# Patient Record
Sex: Female | Born: 1960 | Race: White | Hispanic: No | Marital: Married | Smoking: Never smoker
Health system: Southern US, Community
[De-identification: ages and names within clinical notes are randomized; demographics above are authoritative.]

## PROBLEM LIST (undated history)

## (undated) DIAGNOSIS — E669 Obesity, unspecified: Secondary | ICD-10-CM

## (undated) DIAGNOSIS — I10 Essential (primary) hypertension: Secondary | ICD-10-CM

## (undated) HISTORY — DX: Obesity, unspecified: E66.9

## (undated) HISTORY — DX: Essential (primary) hypertension: I10

## (undated) NOTE — *Deleted (*Deleted)
PROGRESS NOTE  Sandy Reed  DOB: May 03, 1961  PCP: Stacie Glaze, MD ZOX:[id number]  DOA: 10/13/2020  LOS: 0 days   Chief Complaint  Patient presents with  . Loss of Vision   *** Brief narrative: Sandy Reed is a 73 y.o. female with PMH significant for morbid obesity, hypertension, migraine. She presented to the ED on 10/28 with 10 days history slowly progressing right eye visual loss.  She saw an ophthalmologist in the office, noted to have optic nerve edema on examination and was referred to ED for MRI.  Patient described the symptoms as if she was "looking through a [dark grey-translucent] piece of lace" through the central filed of her right eye with the adjacent nasal field "like everything is cut off". It started off as black spots floating which gradually expanded. Prior to admission he had a CT scan of head done in hospital which was negative.  In the ED, MRI brain was obtained which did not show any acute finding.  On-call neurologist was consulted.  Patient was admitted for further work-up .  Subjective: Patient was seen and examined this morning. Symptoms continue.  Patient underwent MRA of her neck with findings as below.  Assessment/Plan: Right Eye Medial Peripheral Visual Loss  -Neurology consult appreciated. 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   MRI of the orbits without acute findings.  MRA head and neck with narrowing and tortuosity of the left vertebral artery which terminates as PICA. Per neurologist review, CCA and right vertebral artery narrowing are more consistent with motion artifact.  She has no symptoms of GCA although CRP is elevated.  Neurologist has ordered for vasculitis  work-up in addition to other possible sources of vision loss: ANA, ANCA, C3/C4, bartonella henselae ab, Lyme PCR  Continue aspirin 81 mg  2D Echo pending  LDL 106 - can treat to LDL goal <130, no increased risk factor of stroke  We'll discharge the patient on aspirin 81 mg daily.  Patient will continue to follow-up with ophthalmology and neurology as an outpatient.  Hypertension -Home meds include verapamil, benazepril and hydrochlorothiazide. -Continue the same to target blood pressure less than 140/90.  History of migraines  Stable for discharge to home today.  Mobility: *** Code Status:   Code Status: Full Code *** Nutritional status: Body mass index is 50.12 kg/m.     Diet Order            Diet Heart Room service appropriate? Yes; Fluid consistency: Thin  Diet effective now                *** DVT prophylaxis: enoxaparin (LOVENOX) injection 40 mg Start: 10/14/20 1000   Antimicrobials: *** Fluid: *** Consultants: *** Family Communication: ***  Status is: Observation  {Observation:23811}  Dispo: The patient is from: {From:23814}              Anticipated d/c is to: {To:23815}              Anticipated d/c date is: {Days:23816}              Patient currently {Medically stable:23817}       Infusions:    Scheduled Meds: .  stroke: mapping our early stages of recovery book   Does not apply  Once  . aspirin EC  81 mg Oral Daily  . atorvastatin  80 mg Oral Daily  . benazepril  10 mg Oral Daily   And  . hydrochlorothiazide  12.5 mg Oral Daily  . clopidogrel  75 mg Oral Daily  . enoxaparin (LOVENOX) injection  40 mg Subcutaneous Q24H    Antimicrobials: Anti-infectives (From admission, onward)   None      PRN meds: acetaminophen **OR** acetaminophen (TYLENOL) oral liquid 160 mg/5 mL **OR** acetaminophen, perflutren lipid microspheres (DEFINITY) IV suspension   Objective: Vitals:   10/14/20 0800 10/14/20 1141  BP: (!) 161/98 140/73  Pulse: 97 91   Resp: (!) 22 20  Temp: 98.7 F (37.1 C) 98.8 F (37.1 C)  SpO2: 93% 97%   No intake or output data in the 24 hours ending 10/14/20 1233 Filed Weights   10/13/20 1607  Weight: (!) 145.2 kg   Weight change:  Body mass index is 50.12 kg/m.   Physical Exam: General exam: Appears calm and comfortable. *** Skin: No rashes, lesions or ulcers. HEENT: Atraumatic, normocephalic, supple neck, no obvious bleeding Lungs: *** CVS: *** GI/Abd *** CNS: *** Psychiatry: *** Extremities: ***  Data Review: I have personally reviewed the laboratory data and studies available.  Recent Labs  Lab 10/13/20 1959 10/14/20 0526  WBC 10.3 8.0  NEUTROABS 7.0  --   HGB 14.7 13.4  HCT 45.4 41.6  MCV 94.4 94.3  PLT 287 272   Recent Labs  Lab 10/13/20 2122 10/14/20 0526  NA 136  --   K 3.6  --   CL 101  --   CO2 23  --   GLUCOSE 91  --   BUN 13  --   CREATININE 0.74 0.68  CALCIUM 9.3  --     F/u labs ***  Signed, Lorin Glass, MD [facility] 10/14/2020

---

## 1998-05-05 ENCOUNTER — Emergency Department (HOSPITAL_COMMUNITY): Admission: EM | Admit: 1998-05-05 | Discharge: 1998-05-05 | Payer: Self-pay | Admitting: Emergency Medicine

## 2000-06-27 ENCOUNTER — Other Ambulatory Visit: Admission: RE | Admit: 2000-06-27 | Discharge: 2000-06-27 | Payer: Self-pay | Admitting: Internal Medicine

## 2001-06-26 ENCOUNTER — Encounter: Payer: Self-pay | Admitting: Emergency Medicine

## 2001-06-26 ENCOUNTER — Emergency Department (HOSPITAL_COMMUNITY): Admission: EM | Admit: 2001-06-26 | Discharge: 2001-06-26 | Payer: Self-pay | Admitting: Emergency Medicine

## 2001-07-03 ENCOUNTER — Encounter: Payer: Self-pay | Admitting: Neurology

## 2001-07-03 ENCOUNTER — Inpatient Hospital Stay (HOSPITAL_COMMUNITY): Admission: AD | Admit: 2001-07-03 | Discharge: 2001-07-04 | Payer: Self-pay | Admitting: Neurology

## 2001-07-03 ENCOUNTER — Encounter: Payer: Self-pay | Admitting: Emergency Medicine

## 2001-07-04 ENCOUNTER — Encounter: Payer: Self-pay | Admitting: Neurology

## 2001-07-11 ENCOUNTER — Emergency Department (HOSPITAL_COMMUNITY): Admission: EM | Admit: 2001-07-11 | Discharge: 2001-07-12 | Payer: Self-pay | Admitting: Emergency Medicine

## 2003-01-18 ENCOUNTER — Other Ambulatory Visit: Admission: RE | Admit: 2003-01-18 | Discharge: 2003-01-18 | Payer: Self-pay | Admitting: Internal Medicine

## 2004-03-02 ENCOUNTER — Other Ambulatory Visit: Admission: RE | Admit: 2004-03-02 | Discharge: 2004-03-02 | Payer: Self-pay | Admitting: Internal Medicine

## 2005-08-30 ENCOUNTER — Ambulatory Visit: Payer: Self-pay | Admitting: Internal Medicine

## 2005-09-12 ENCOUNTER — Other Ambulatory Visit: Admission: RE | Admit: 2005-09-12 | Discharge: 2005-09-12 | Payer: Self-pay | Admitting: Internal Medicine

## 2005-09-12 ENCOUNTER — Ambulatory Visit: Payer: Self-pay | Admitting: Internal Medicine

## 2007-03-06 ENCOUNTER — Ambulatory Visit: Payer: Self-pay | Admitting: Internal Medicine

## 2007-03-06 LAB — CONVERTED CEMR LAB
ALT: 20 units/L (ref 0–40)
AST: 22 units/L (ref 0–37)
Albumin: 3.7 g/dL (ref 3.5–5.2)
Alkaline Phosphatase: 86 units/L (ref 39–117)
BUN: 10 mg/dL (ref 6–23)
Basophils Absolute: 0 10*3/uL (ref 0.0–0.1)
Basophils Relative: 0.5 % (ref 0.0–1.0)
Bilirubin, Direct: 0.1 mg/dL (ref 0.0–0.3)
CO2: 29 meq/L (ref 19–32)
Calcium: 9.1 mg/dL (ref 8.4–10.5)
Chloride: 103 meq/L (ref 96–112)
Cholesterol: 173 mg/dL (ref 0–200)
Creatinine, Ser: 0.6 mg/dL (ref 0.4–1.2)
Eosinophils Absolute: 0.3 10*3/uL (ref 0.0–0.6)
Eosinophils Relative: 4.5 % (ref 0.0–5.0)
GFR calc Af Amer: 138 mL/min
GFR calc non Af Amer: 114 mL/min
Glucose, Bld: 93 mg/dL (ref 70–99)
HCT: 40 % (ref 36.0–46.0)
HDL: 44.5 mg/dL (ref >39.0)
Hemoglobin: 13.6 g/dL (ref 12.0–15.0)
LDL Cholesterol: 111 mg/dL — ABNORMAL HIGH (ref 0–99)
Lymphocytes Relative: 24 % (ref 12.0–46.0)
MCHC: 34.1 g/dL (ref 30.0–36.0)
MCV: 89.6 fL (ref 78.0–100.0)
Monocytes Absolute: 0.4 10*3/uL (ref 0.2–0.7)
Monocytes Relative: 6 % (ref 3.0–11.0)
Neutro Abs: 5 10*3/uL (ref 1.4–7.7)
Neutrophils Relative %: 65 % (ref 43.0–77.0)
Platelets: 337 10*3/uL (ref 150–400)
Potassium: 3.7 meq/L (ref 3.5–5.1)
RBC: 4.46 M/uL (ref 3.87–5.11)
RDW: 12.7 % (ref 11.5–14.6)
Sodium: 139 meq/L (ref 135–145)
TSH: 2.29 microintl units/mL (ref 0.35–5.50)
Total Bilirubin: 0.6 mg/dL (ref 0.3–1.2)
Total CHOL/HDL Ratio: 3.9
Total Protein: 7.2 g/dL (ref 6.0–8.3)
Triglycerides: 90 mg/dL (ref 0–149)
VLDL: 18 mg/dL (ref 0–40)
WBC: 7.5 10*3/uL (ref 4.5–10.5)

## 2007-03-14 ENCOUNTER — Encounter: Payer: Self-pay | Admitting: Internal Medicine

## 2007-03-14 ENCOUNTER — Ambulatory Visit: Payer: Self-pay | Admitting: Internal Medicine

## 2007-03-14 ENCOUNTER — Other Ambulatory Visit: Admission: RE | Admit: 2007-03-14 | Discharge: 2007-03-14 | Payer: Self-pay | Admitting: Internal Medicine

## 2007-08-25 DIAGNOSIS — I10 Essential (primary) hypertension: Secondary | ICD-10-CM | POA: Insufficient documentation

## 2009-05-24 ENCOUNTER — Ambulatory Visit: Payer: Self-pay | Admitting: Internal Medicine

## 2009-05-24 DIAGNOSIS — T887XXA Unspecified adverse effect of drug or medicament, initial encounter: Secondary | ICD-10-CM | POA: Insufficient documentation

## 2009-05-24 DIAGNOSIS — E785 Hyperlipidemia, unspecified: Secondary | ICD-10-CM

## 2009-05-24 DIAGNOSIS — E663 Overweight: Secondary | ICD-10-CM | POA: Insufficient documentation

## 2009-05-24 LAB — CONVERTED CEMR LAB
BUN: 11 mg/dL
Basophils Absolute: 0 K/uL
Basophils Relative: 0.3 %
CO2: 26 meq/L
Calcium: 9.3 mg/dL
Chloride: 111 meq/L
Cholesterol: 199 mg/dL
Creatinine, Ser: 0.6 mg/dL
Direct LDL: 141.6 mg/dL
Eosinophils Absolute: 0.2 K/uL
Eosinophils Relative: 3.1 %
GFR calc non Af Amer: 113.25 mL/min
Glucose, Bld: 86 mg/dL
HCT: 41.5 %
HDL: 41.8 mg/dL
Hemoglobin: 14.2 g/dL
Lymphocytes Relative: 28.1 %
Lymphs Abs: 2.1 K/uL
MCHC: 34.2 g/dL
MCV: 90.6 fL
Monocytes Absolute: 0.5 K/uL
Monocytes Relative: 6.3 %
Neutro Abs: 4.5 K/uL
Neutrophils Relative %: 62.2 %
Platelets: 304 K/uL
Potassium: 3.5 meq/L
RBC: 4.58 M/uL
RDW: 12.9 %
Sodium: 142 meq/L
TSH: 2.26 u[IU]/mL
WBC: 7.3 10*3/microliter

## 2009-08-05 ENCOUNTER — Ambulatory Visit: Payer: Self-pay | Admitting: Internal Medicine

## 2011-05-04 NOTE — Consult Note (Signed)
[facility]  Patient:    Sandy Reed, Sandy Reed                     MRN: [id number] Proc. Date: 07/03/01 Adm. Date:  [id number] Attending:  Fenton Malling                          Consultation Report  CHIEF COMPLAINT:  "Cant speak right side."  HISTORY OF PRESENT ILLNESS:  Sandy Reed is a 50 year old, left-handed woman with a past medical history significant for hypertension, who came to the emergency room last week on June 26, 2001, with complaints of left arm and face numbness, unable to use left arm for 45 minutes.  She had no speech changes then.  Her exam was normal and CT scan of brain was normal.  For some reason an MRI scan of the neck was set up as an outpatient and reportedly showed a disc bulge.  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  She has had a few bouts of emesis associated with this.  No loss of consciousness.  She does have a headache.  In the emergency room she was also noted to have difficulty speaking, would only say I do not know to all questions.  These symptoms are gradually clearing except for the headache and nausea and emesis.  REVIEW OF SYSTEMS:  Positive for headache, nausea and vomiting.  No chest pain or shortness of breath.  PAST MEDICAL HISTORY:  Significant for hypertension and obesity.  No history of diabetes or coronary artery disease.  No surgeries.  No history of migraines.  MEDICATIONS AT HOME:  Lotensin, hydrochlorothiazide, aspirin.  ALLERGIES:  No known drug allergies.  SOCIAL HISTORY:  She is single, no children, does not smoke cigarettes, and does not use alcohol.  Uses occasionally caffeine.  She works for an [employer], educational.  FAMILY HISTORY:  Positive for hypertension and no history of  stroke.  PHYSICAL EXAMINATION:  VITAL SIGNS:  Temperature 97.8, pulse 120, respirations 18, and blood pressure 146/91.  HEENT:  Head is normocephalic, atraumatic.  NECK:  Supple without bruits.  LUNGS:  Clear to auscultation.  HEART:  Regular rate and rhythm.  ABDOMEN:  Obese.  EXTREMITIES:  Without edema.  NEUROLOGIC:  Mental status:  She is awake.  She is beginning to speak more, but having some word finding errors.  Trouble with complex repetition. Trouble with complex commands.  Her naming is intact.  Cranial nerves:  Pupils are equal and reactive.  Disc margins are sharp.  Visual fields are full. Extraocular movements are intact.  Facial sensation is normal.  Facial motor activity is normal.  Hearing is intact.  Palate symmetric and tongue midline. On motor exam she has no drift or satelliting.  Normal rapid fine movement. Normal bulk, tone, and strength.  I did not examine the gait.  Reflexes are 2+ with downgoing toes, coordination finger-to-nose, heel-to-shin are intact. Did not try tandem gait.  Sensory exam is intact.  X-RAY:  CT of the brain is again normal with a probable benign cyst in the right temporal region.  LABORATORY:  So far includes toxicology screen which is negative.  Other labs are pending.  IMPRESSION:  Fluctuating right-sided  numbness and language disturbance, rule out stroke or transient ischemic attack.  History of left-sided numbness one week ago caused me to worry that this could reflect an embolic event.  PLAN:  Will admit to the stroke service at [facility], IV heparin, fluids, oxygen, MRI of brain, MR angiogram, carotid Doppler, cardiac echo, and EEG. DD:  07/03/01 TD:  07/03/01 Job: [id number]

## 2011-05-04 NOTE — Discharge Summary (Signed)
[facility]  Patient:    Sandy Reed, Sandy Reed                     MRN: [id number] Adm. Date:  [id number] Disc. Date: [id number] Attending:  Cathren Reed CC:         Sandy Reed, M.D. [facility]   Discharge Summary  DATE OF BIRTH:  Feb 28, 1961  ADMISSION DIAGNOSES: 1. Transient right-sided numbness and language disturbance, rule out vascular    event. 2. History of hypertension.  DISCHARGE DIAGNOSES: 1. Transient right hemisensory changes and speech disturbance, suspected    migraine event, less likely seizure, much less likely transient ischemic    attack, now resolved. 2. History of hypertension.  CONDITION ON DISCHARGE:  Good.  DIET:  No added salt.  ACTIVITY:  Ad lib.  May return to work on July 14, 2001.  DISCHARGE MEDICATIONS: 1. Plavix 75 mg p.o. q.d. (new). 2. Verapamil SR 120 mg p.o. q.d. (new). 3. Aspirin 325 mg p.o. q.d. 4. Lotensin 20 mg p.o. q.d. 5. Hydrochlorothiazide 25 mg p.o. q.d.  STUDIES: 1. CT head performed July 03, 2001, at [facility], revealing no    acute abnormality. 2. MRI of the brain performed July 04, 2001, at [facility], revealing    no acute abnormality (preliminary). 3. MRA of the brain performed July 04, 2001, revealing hypoplastic left    vertebral artery and no acute abnormality (preliminary). 4. Carotid Doppler performed July 04, 2001, normal. 5. Transthoracic echocardiogram performed July 04, 2001, with normal LV size    and function.  No regional wall motion abnormalities.  EF 55-65%.  Mild    dilation of left atrium. 6. EKG performed July 03, 2001, at [facility], revealing sinus    arrhythmia but no acute abnormalities. 7. EEG performed July 04, 2001, final report pending at discharge, preliminary    report indicating left hemispheric slowing.  LABORATORY DATA:  CBC July 03, 2001, revealing elevated white cell count, improved on July 04, 2001, otherwise normal.   CMET normal except for hypokalemia in the 2.9-3.2 range.  Coagulations normal.  Urinalysis normal. Lipid panel normal.  Homocystine level normal.  Antithrombin III level normal. Prothrombin gene mutation, factor V Leiden, methylmalonic acid level, protein C and protein S activity levels, anticardiolipin antibody, and lupus anticoagulant profile all pending at the time of discharge.  HISTORY OF PRESENT ILLNESS:  Please see admission H&P by Dr. Marcelino Freestone for full admission details.  Briefly, this is a 50 year old woman with a history of hypertension who was admitted to the [facility] Stroke Unit from the [facility] Emergency Room for an episode of spreading right-sided numbness associated with speech disturbance, as well as a history of numbness of the left face and leg occurring about a week prior.  Both episodes were associated with headache, although she has no clear history of migraines.  HOSPITAL COURSE:  She was admitted to the stroke unit, and routine stroke protocol was undertaken.  She was placed on heparin until the completion of her workup.  By the next morning her symptoms of numbness and speech disturbance had improved dramatically.  By the time she was examined on the evening of July 04, 2001, they had essentially resolved, and she complained only of a mild throbbing headache behind the eyes.  She did have some headache and nausea during the hospital stay which was treated with Toradol and Phenergan with success.  She underwent routine  stroke workup, including the above studies, which were all unremarkable.  Of note, much of her hypercoagulable workup was still pending at the time of discharge, as well as an official report on her EEG, which was thought to reveal left hemispheric slowing.  After careful review of the history with the patient, it was felt that the most likely explanation for her symptoms was a migraine phenomenon. She was placed on her usual  medications at discharge, and to this regimen was added Plavix 75 mg p.o. q.d. for additional vascular prophylaxis, as well as sustained-release verapamil 120 mg p.o. q.d. for migraine prophylaxis.  She was advised to watch her blood pressure and watch her symptoms of orthostasis on the verapamil and, if ______  problem, to decrease her Lotensin dose.  She was deemed stable and was discharged home on the evening of July 04, 2001.  FOLLOW-UP:  She will follow up at [facility] next week with Demetrio Lapping, P.A.C., under the direction of Dr. Kelli Hope and Dr. Marcelino Freestone.  Considerations at that time will be possible discontinuation of the Plavix, possible arrangement of a transesophageal echocardiogram, and follow up of the hypercoagulable state workup.  She is to call [phone] on Monday for an appointment for some time next week. DD:  07/04/01 TD:  07/07/01 Job: [id number]

## 2011-06-13 ENCOUNTER — Other Ambulatory Visit: Payer: Self-pay | Admitting: *Deleted

## 2011-06-13 MED ORDER — BENAZEPRIL-HYDROCHLOROTHIAZIDE 20-12.5 MG PO TABS: 1.0000 | ORAL_TABLET | Freq: Every day | ORAL | Status: DC

## 2011-06-13 MED ORDER — VERAPAMIL HCL ER 240 MG PO TBCR: 240.0000 mg | EXTENDED_RELEASE_TABLET | Freq: Every day | ORAL | Status: DC

## 2011-06-27 ENCOUNTER — Other Ambulatory Visit: Payer: Self-pay | Admitting: Internal Medicine

## 2011-06-27 MED ORDER — BENAZEPRIL-HYDROCHLOROTHIAZIDE 20-12.5 MG PO TABS: 1.0000 | ORAL_TABLET | Freq: Every day | ORAL | Status: DC

## 2011-06-27 NOTE — Telephone Encounter (Signed)
resent

## 2011-06-27 NOTE — Telephone Encounter (Signed)
[facility] home delivery sent over a refill req for Benazep/HCTZ 10-12.5 mg 1 per day, last week. Pt says that med has not been rcvd. Pt wants to know if request needs to be re-sent or can this be call in to [facility] Deliver [phone].

## 2012-08-12 ENCOUNTER — Ambulatory Visit (INDEPENDENT_AMBULATORY_CARE_PROVIDER_SITE_OTHER): Payer: Managed Care, Other (non HMO) | Admitting: Internal Medicine

## 2012-08-12 ENCOUNTER — Encounter: Payer: Self-pay | Admitting: Internal Medicine

## 2012-08-12 VITALS — BP 134/84 | HR 76 | Temp 98.2°F | Resp 16 | Ht 67.0 in | Wt 291.0 lb

## 2012-08-12 DIAGNOSIS — E669 Obesity, unspecified: Secondary | ICD-10-CM

## 2012-08-12 DIAGNOSIS — I10 Essential (primary) hypertension: Secondary | ICD-10-CM

## 2012-08-12 DIAGNOSIS — T887XXA Unspecified adverse effect of drug or medicament, initial encounter: Secondary | ICD-10-CM

## 2012-08-12 DIAGNOSIS — E785 Hyperlipidemia, unspecified: Secondary | ICD-10-CM

## 2012-08-12 LAB — LIPID PANEL
HDL: 41.9 mg/dL (ref >39.00)
LDL Cholesterol: 118 mg/dL — ABNORMAL HIGH (ref 0–99)
Total CHOL/HDL Ratio: 4
VLDL: 17.2 mg/dL (ref 0.0–40.0)

## 2012-08-12 LAB — BASIC METABOLIC PANEL
Calcium: 9.4 mg/dL (ref 8.4–10.5)
GFR: 87.75 mL/min (ref >60.00)
Glucose, Bld: 90 mg/dL (ref 70–99)
Potassium: 3.9 mEq/L (ref 3.5–5.1)
Sodium: 141 mEq/L (ref 135–145)

## 2012-08-12 LAB — TSH: TSH: 1.93 u[IU]/mL (ref 0.35–5.50)

## 2012-08-12 LAB — HEPATIC FUNCTION PANEL
AST: 26 U/L (ref 0–37)
Albumin: 4.2 g/dL (ref 3.5–5.2)
Alkaline Phosphatase: 72 U/L (ref 39–117)
Bilirubin, Direct: 0.1 mg/dL (ref 0.0–0.3)
Total Bilirubin: 0.6 mg/dL (ref 0.3–1.2)

## 2012-08-12 MED ORDER — VERAPAMIL HCL ER 240 MG PO TBCR: 240.0000 mg | EXTENDED_RELEASE_TABLET | Freq: Every day | ORAL | Status: DC

## 2012-08-12 MED ORDER — BENAZEPRIL-HYDROCHLOROTHIAZIDE 20-12.5 MG PO TABS: 1.0000 | ORAL_TABLET | Freq: Every day | ORAL | Status: DC

## 2012-08-12 NOTE — Patient Instructions (Signed)
The patient is instructed to continue all medications as prescribed. Schedule followup with check out clerk upon leaving the clinic  

## 2012-08-20 NOTE — Progress Notes (Signed)
Subjective:    Patient ID: Sandy Reed, female    DOB: 08/20/1961, 51 y.o.   MRN: [id number]  HPI Patient is a 51 year old female who presents for followup of hyperlipidemia morbid obesity hypertension.  She is on Lotensin and breath well for blood pressure control which remains adequate We reviewed her weight and weight gain as well as its potential affect on her blood pressure and discussed in detail how diet and exercise would impact both her need for 3 blood pressure medicines and her overall cardiovascular risks.  Reviewed her family history and discussed the effect on her lipids as well as her hypertension of weight loss  Review of Systems  Constitutional: Negative for activity change, appetite change and fatigue.  HENT: Negative for ear pain, congestion, neck pain, postnasal drip and sinus pressure.   Eyes: Negative for redness and visual disturbance.  Respiratory: Negative for cough, shortness of breath and wheezing.   Gastrointestinal: Negative for abdominal pain and abdominal distention.  Genitourinary: Negative for dysuria, frequency and menstrual problem.  Musculoskeletal: Negative for myalgias, joint swelling and arthralgias.  Skin: Negative for rash and wound.  Neurological: Negative for dizziness, weakness and headaches.  Hematological: Negative for adenopathy. Does not bruise/bleed easily.  Psychiatric/Behavioral: Negative for disturbed wake/sleep cycle and decreased concentration.   Past Medical History  Diagnosis Date  . Hypertension   . Obesity     History   Social History  . Marital Status: Married    Spouse Name: N/A    Number of Children: N/A  . Years of Education: N/A   Occupational History  . Not on file.   Social History Main Topics  . Smoking status: Never Smoker   . Smokeless tobacco: Not on file  . Alcohol Use: No  . Drug Use: No  . Sexually Active: Not on file   Other Topics Concern  . Not on file   Social History Narrative  . No  narrative on file    No past surgical history on file.  No family history on file.  Not on File  Current Outpatient Prescriptions on File Prior to Visit  Medication Sig Dispense Refill  . benazepril-hydrochlorthiazide (LOTENSIN HCT) 20-12.5 MG per tablet Take 1 tablet by mouth daily.  90 tablet  1  . verapamil (CALAN-SR) 240 MG CR tablet Take 1 tablet (240 mg total) by mouth daily.  90 tablet  1    BP 134/84  Pulse 76  Temp 98.2 F (36.8 C)  Resp 16  Ht 5\' 7"  (1.702 m)  Wt 291 lb (131.997 kg)  BMI 45.58 kg/m2        Objective:   Physical Exam  Nursing note and vitals reviewed. Constitutional: She is oriented to person, place, and time. She appears well-developed and well-nourished. No distress.  HENT:  Head: Normocephalic and atraumatic.  Right Ear: External ear normal.  Left Ear: External ear normal.  Nose: Nose normal.  Mouth/Throat: Oropharynx is clear and moist.  Eyes: Conjunctivae and EOM are normal. Pupils are equal, round, and reactive to light.  Neck: Normal range of motion. Neck supple. No JVD present. No tracheal deviation present. No thyromegaly present.  Cardiovascular: Normal rate, regular rhythm, normal heart sounds and intact distal pulses.   No murmur heard. Pulmonary/Chest: Effort normal and breath sounds normal. She has no wheezes. She exhibits no tenderness.  Abdominal: Soft. Bowel sounds are normal.  Musculoskeletal: Normal range of motion. She exhibits no edema and no tenderness.  Lymphadenopathy:  She has no cervical adenopathy.  Neurological: She is alert and oriented to person, place, and time. She has normal reflexes. No cranial nerve deficit.  Skin: Skin is warm and dry. She is not diaphoretic.  Psychiatric: She has a normal mood and affect. Her behavior is normal.          Assessment & Plan:  Reviewed morbid obesity and obesity risks  I have spent more than 30 minutes examining this patient face-to-face of which over half was  spent in counseling  Discussed hypertensive control with current medications refilled medications

## 2013-01-30 ENCOUNTER — Other Ambulatory Visit: Payer: Self-pay | Admitting: Internal Medicine

## 2013-02-13 ENCOUNTER — Ambulatory Visit: Payer: Managed Care, Other (non HMO) | Admitting: Internal Medicine

## 2013-05-13 ENCOUNTER — Other Ambulatory Visit: Payer: Self-pay | Admitting: Internal Medicine

## 2013-05-25 ENCOUNTER — Encounter: Payer: Self-pay | Admitting: Internal Medicine

## 2013-05-25 ENCOUNTER — Ambulatory Visit (INDEPENDENT_AMBULATORY_CARE_PROVIDER_SITE_OTHER): Payer: Managed Care, Other (non HMO) | Admitting: Internal Medicine

## 2013-05-25 VITALS — BP 120/78 | HR 76 | Temp 98.6°F | Resp 16 | Ht 67.0 in | Wt 292.0 lb

## 2013-05-25 DIAGNOSIS — E785 Hyperlipidemia, unspecified: Secondary | ICD-10-CM

## 2013-05-25 DIAGNOSIS — I10 Essential (primary) hypertension: Secondary | ICD-10-CM

## 2013-05-25 DIAGNOSIS — E663 Overweight: Secondary | ICD-10-CM

## 2013-05-25 MED ORDER — BENAZEPRIL-HYDROCHLOROTHIAZIDE 20-12.5 MG PO TABS: ORAL_TABLET | ORAL | Status: DC

## 2013-05-25 MED ORDER — VERAPAMIL HCL ER 240 MG PO TBCR: EXTENDED_RELEASE_TABLET | ORAL | Status: DC

## 2013-05-25 NOTE — Progress Notes (Signed)
  Subjective:    Patient ID: Sandy Reed, female    DOB: 07/01/1961, 52 y.o.   MRN: [id number]  HPI Discussion of diet and exercise Weight reduction needed HTN stable   Review of Systems  Constitutional: Negative for activity change, appetite change and fatigue.  HENT: Negative for ear pain, congestion, neck pain, postnasal drip and sinus pressure.   Eyes: Negative for redness and visual disturbance.  Respiratory: Negative for cough, shortness of breath and wheezing.   Gastrointestinal: Negative for abdominal pain and abdominal distention.  Genitourinary: Negative for dysuria, frequency and menstrual problem.  Musculoskeletal: Negative for myalgias, joint swelling and arthralgias.  Skin: Negative for rash and wound.  Neurological: Negative for dizziness, weakness and headaches.  Hematological: Negative for adenopathy. Does not bruise/bleed easily.  Psychiatric/Behavioral: Negative for sleep disturbance and decreased concentration.       Objective:   Physical Exam  Constitutional: She is oriented to person, place, and time. She appears well-developed and well-nourished. No distress.  HENT:  Head: Normocephalic and atraumatic.  Right Ear: External ear normal.  Left Ear: External ear normal.  Nose: Nose normal.  Mouth/Throat: Oropharynx is clear and moist.  Eyes: Conjunctivae and EOM are normal. Pupils are equal, round, and reactive to light.  Neck: Normal range of motion. Neck supple. No JVD present. No tracheal deviation present. No thyromegaly present.  Cardiovascular: Normal rate, regular rhythm, normal heart sounds and intact distal pulses.   No murmur heard. Pulmonary/Chest: Effort normal and breath sounds normal. She has no wheezes. She exhibits no tenderness.  Abdominal: Soft. Bowel sounds are normal.  Musculoskeletal: Normal range of motion. She exhibits no edema and no tenderness.  Lymphadenopathy:    She has no cervical adenopathy.  Neurological: She is alert  and oriented to person, place, and time. She has normal reflexes. No cranial nerve deficit.  Skin: Skin is warm and dry. She is not diaphoretic.  Psychiatric: She has a normal mood and affect. Her behavior is normal.          Assessment & Plan:  Stable HTN Discussion of weight and exercise

## 2013-11-09 ENCOUNTER — Encounter: Payer: Managed Care, Other (non HMO) | Admitting: Internal Medicine

## 2014-06-11 ENCOUNTER — Other Ambulatory Visit: Payer: Self-pay | Admitting: Internal Medicine

## 2014-08-22 ENCOUNTER — Other Ambulatory Visit: Payer: Self-pay | Admitting: Internal Medicine

## 2014-09-08 ENCOUNTER — Telehealth: Payer: Self-pay | Admitting: Internal Medicine

## 2014-09-08 NOTE — Telephone Encounter (Signed)
Pt called upset about her re-fills being refused due to the need for an office visit.  Pt asked when was the last time she was in the office and I told her it was 05/2013.  She asked about going to Saturday clinic for re-fills and I explained the Saturday clinic is for acute issues.   She said she can't take time off of work to see a physician to establish care or med check and will have to do without her medication.  Pt then stated "she will have to do without medication and said thank you very much".  The call then ended.

## 2020-10-13 ENCOUNTER — Other Ambulatory Visit: Payer: Self-pay

## 2020-10-13 ENCOUNTER — Encounter (HOSPITAL_COMMUNITY): Payer: Self-pay | Admitting: Emergency Medicine

## 2020-10-13 ENCOUNTER — Emergency Department (HOSPITAL_COMMUNITY): Payer: Managed Care, Other (non HMO)

## 2020-10-13 ENCOUNTER — Observation Stay (HOSPITAL_COMMUNITY)
Admission: EM | Admit: 2020-10-13 | Discharge: 2020-10-14 | Disposition: A | Discharge status: Home / Self Care | Payer: Managed Care, Other (non HMO) | Attending: Internal Medicine | Admitting: Internal Medicine

## 2020-10-13 DIAGNOSIS — Z20822 Contact with and (suspected) exposure to covid-19: Secondary | ICD-10-CM | POA: Insufficient documentation

## 2020-10-13 DIAGNOSIS — H34231 Retinal artery branch occlusion, right eye: Secondary | ICD-10-CM | POA: Diagnosis present

## 2020-10-13 DIAGNOSIS — H3411 Central retinal artery occlusion, right eye: Secondary | ICD-10-CM | POA: Diagnosis not present

## 2020-10-13 DIAGNOSIS — H4710 Unspecified papilledema: Secondary | ICD-10-CM | POA: Diagnosis not present

## 2020-10-13 DIAGNOSIS — H547 Unspecified visual loss: Secondary | ICD-10-CM | POA: Diagnosis present

## 2020-10-13 DIAGNOSIS — Z79899 Other long term (current) drug therapy: Secondary | ICD-10-CM | POA: Insufficient documentation

## 2020-10-13 DIAGNOSIS — I10 Essential (primary) hypertension: Secondary | ICD-10-CM | POA: Diagnosis not present

## 2020-10-13 DIAGNOSIS — H53451 Other localized visual field defect, right eye: Secondary | ICD-10-CM | POA: Diagnosis present

## 2020-10-13 LAB — BASIC METABOLIC PANEL
Anion gap: 12 (ref 5–15)
BUN: 13 mg/dL (ref 6–20)
CO2: 23 mmol/L (ref 22–32)
Calcium: 9.3 mg/dL (ref 8.9–10.3)
Chloride: 101 mmol/L (ref 98–111)
Creatinine, Ser: 0.74 mg/dL (ref 0.44–1.00)
GFR, Estimated: >60 mL/min (ref >60)
Glucose, Bld: 91 mg/dL (ref 70–99)
Potassium: 3.6 mmol/L (ref 3.5–5.1)
Sodium: 136 mmol/L (ref 135–145)

## 2020-10-13 LAB — SEDIMENTATION RATE: Sed Rate: 17 mm/hr (ref 0–22)

## 2020-10-13 LAB — CBC WITH DIFFERENTIAL/PLATELET
Abs Immature Granulocytes: 0.03 10*3/uL (ref 0.00–0.07)
Basophils Absolute: 0.1 10*3/uL (ref 0.0–0.1)
Basophils Relative: 1 %
Eosinophils Absolute: 0.3 10*3/uL (ref 0.0–0.5)
Eosinophils Relative: 3 %
HCT: 45.4 % (ref 36.0–46.0)
Hemoglobin: 14.7 g/dL (ref 12.0–15.0)
Immature Granulocytes: 0 %
Lymphocytes Relative: 22 %
Lymphs Abs: 2.3 10*3/uL (ref 0.7–4.0)
MCH: 30.6 pg (ref 26.0–34.0)
MCHC: 32.4 g/dL (ref 30.0–36.0)
MCV: 94.4 fL (ref 80.0–100.0)
Monocytes Absolute: 0.7 10*3/uL (ref 0.1–1.0)
Monocytes Relative: 6 %
Neutro Abs: 7 10*3/uL (ref 1.7–7.7)
Neutrophils Relative %: 68 %
Platelets: 287 10*3/uL (ref 150–400)
RBC: 4.81 MIL/uL (ref 3.87–5.11)
RDW: 13 % (ref 11.5–15.5)
WBC: 10.3 10*3/uL (ref 4.0–10.5)
nRBC: 0 % (ref 0.0–0.2)

## 2020-10-13 LAB — C-REACTIVE PROTEIN: CRP: 2 mg/dL — ABNORMAL HIGH (ref <1.0)

## 2020-10-13 IMAGING — MR MR ORBITS WO/W CM
4 of 6 series · 19 of 48 positions shown · IV contrast (gadavist)
Comparison: None.

CLINICAL DATA: Headache and papilledema

EXAM:
MRI HEAD AND ORBITS WITHOUT AND WITH CONTRAST
TECHNIQUE: Multiplanar, multiecho pulse sequences of the brain and surrounding
structures were obtained without and with intravenous contrast.
Multiplanar, multiecho pulse sequences of the orbits and surrounding
structures were obtained including fat saturation techniques, before
and after intravenous contrast administration.
CONTRAST:  10mL GADAVIST GADOBUTROL 1 MMOL/ML IV SOLN

[Series 8: T2 fat-sat · coronal · 4.0mm · 0.35mm/px · 9 of 23 slices shown (1 of 2)]
[im 1/23]
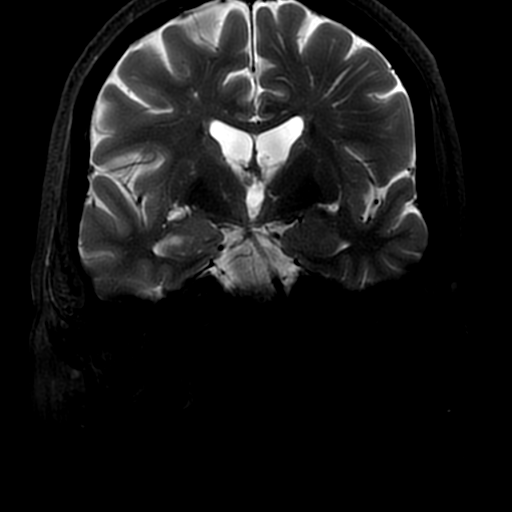
[im 3/23]
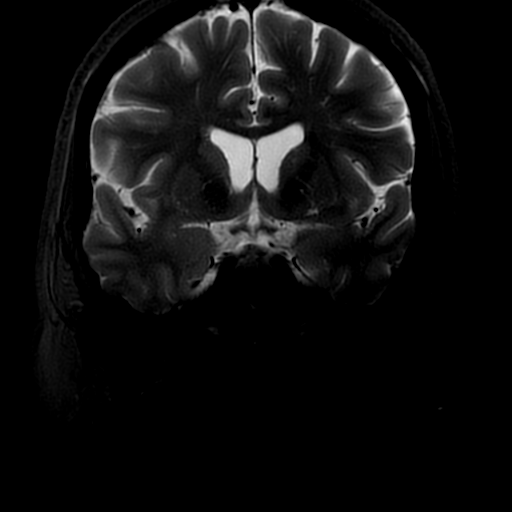
[im 6/23]
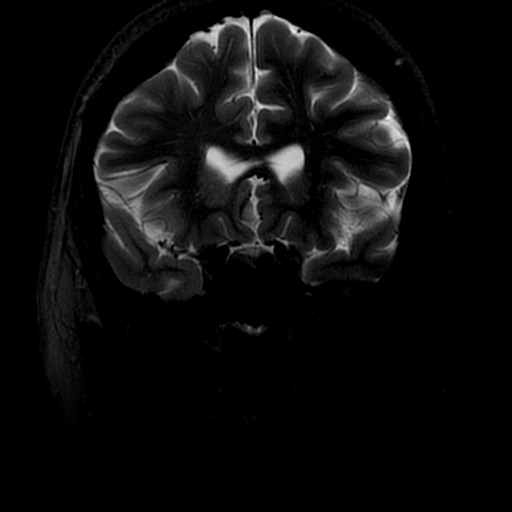
[im 9/23]
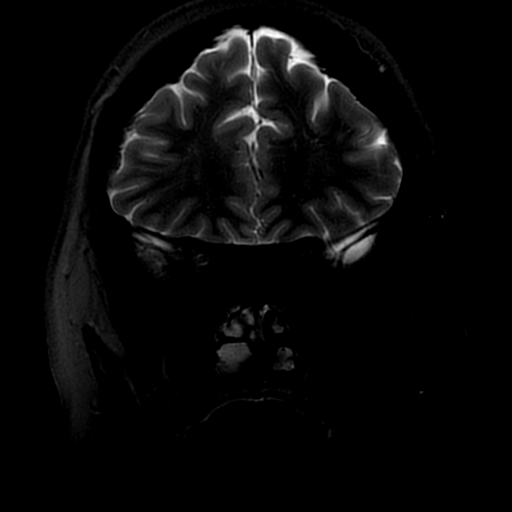
[im 12/23]
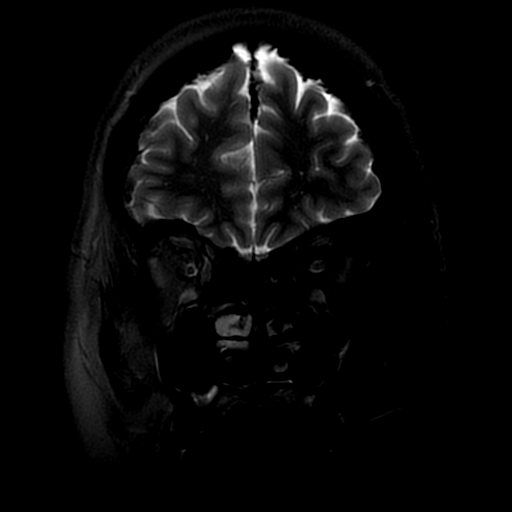
[im 14/23]
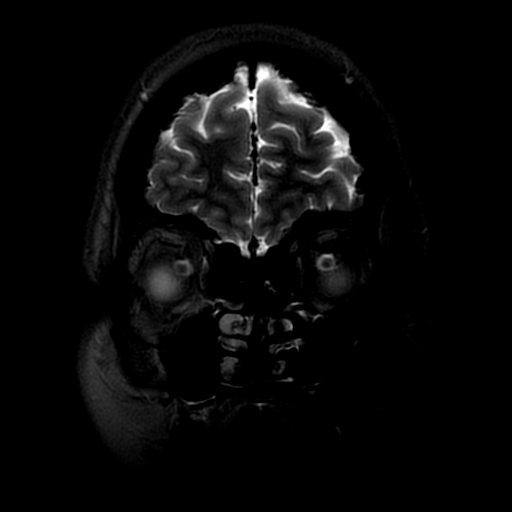
[im 17/23]
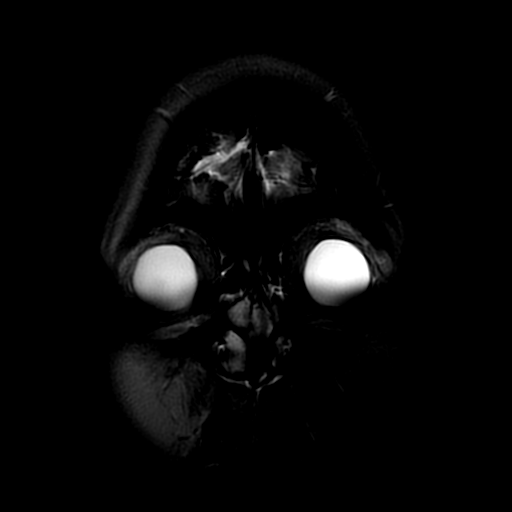
[im 20/23]
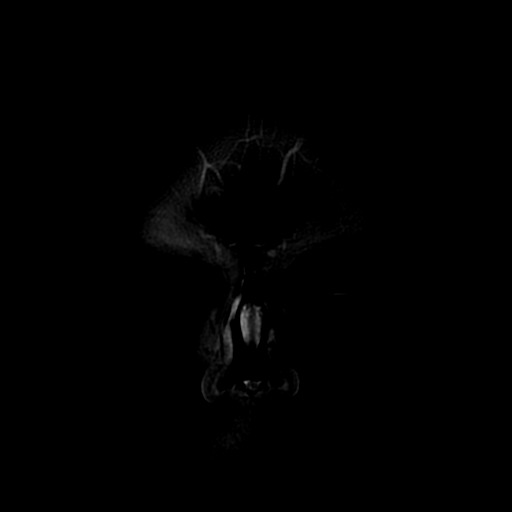
[im 23/23]
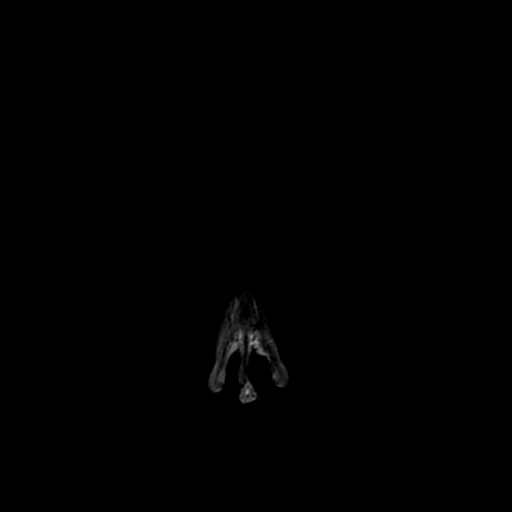

[Series 9: T2 fat-sat · axial · 3.0mm · 0.35mm/px · z∈[-46,-2]mm · 4 of 20 slices shown (2 of 2)]
[im 1/20]
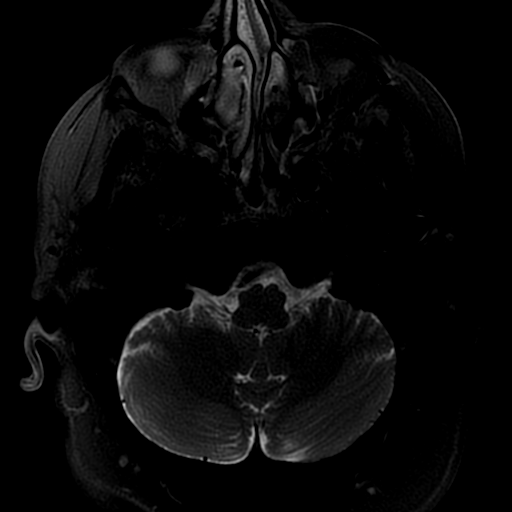
[im 4/20]
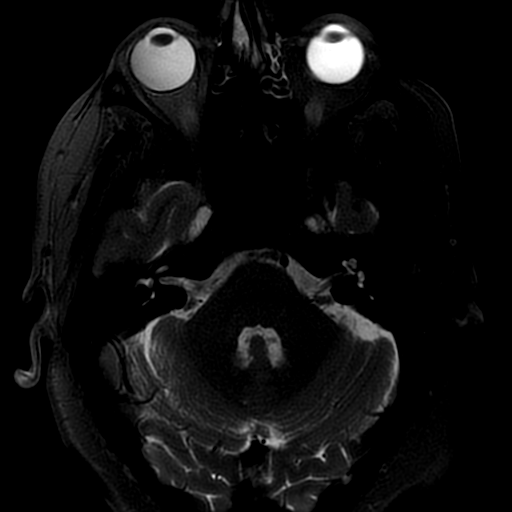
[im 10/20]
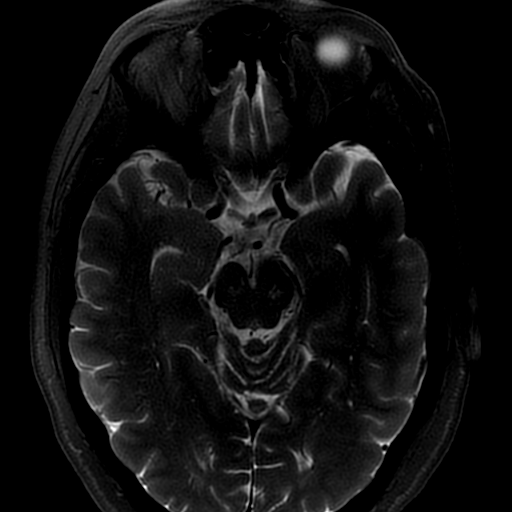
[im 16/20]
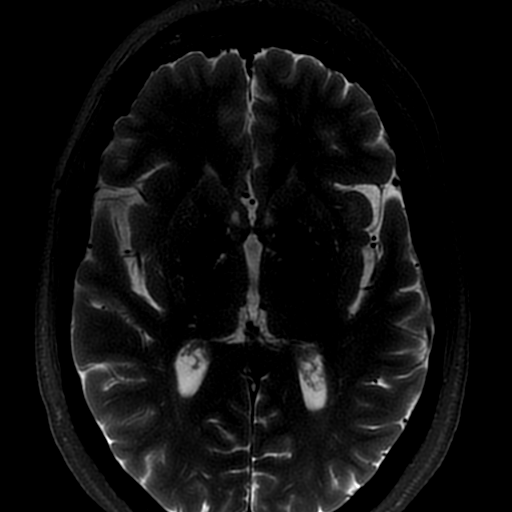

[Series 10: T1 · coronal · 4.0mm · 0.35mm/px · 3 of 23 slices shown (1 of 2)]
[im 3/23]
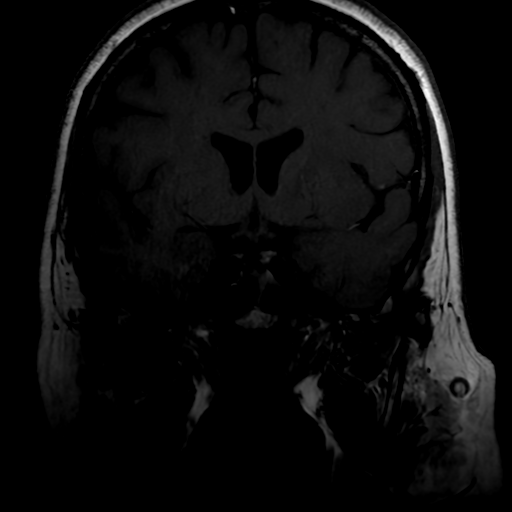
[im 12/23]
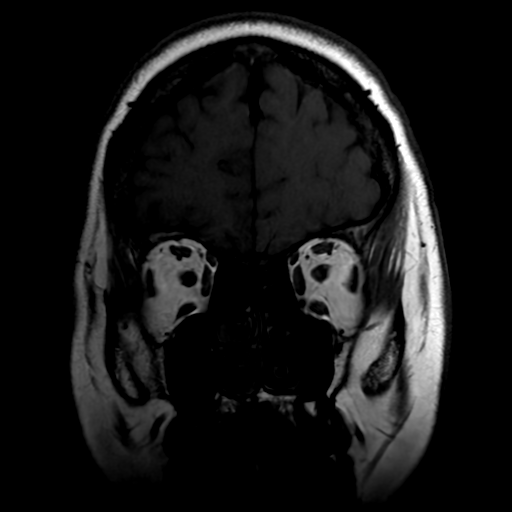
[im 20/23]
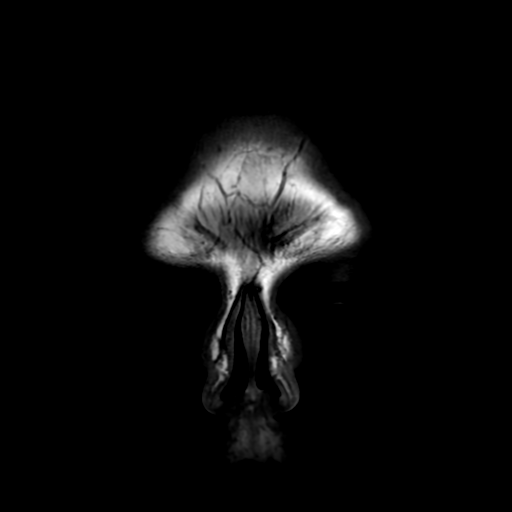

[Series 11: T1 · axial · 3.0mm · 0.35mm/px · z∈[-37,-2]mm · 3 of 20 slices shown (2 of 2)]
[im 4/20]
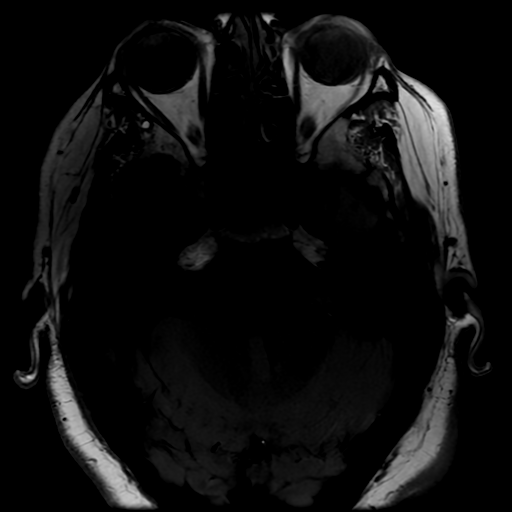
[im 10/20]
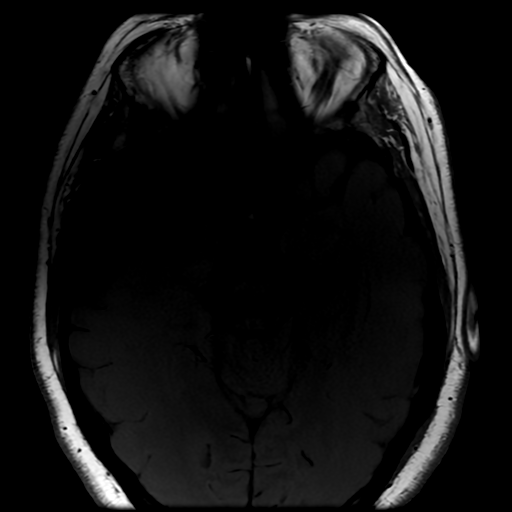
[im 16/20]
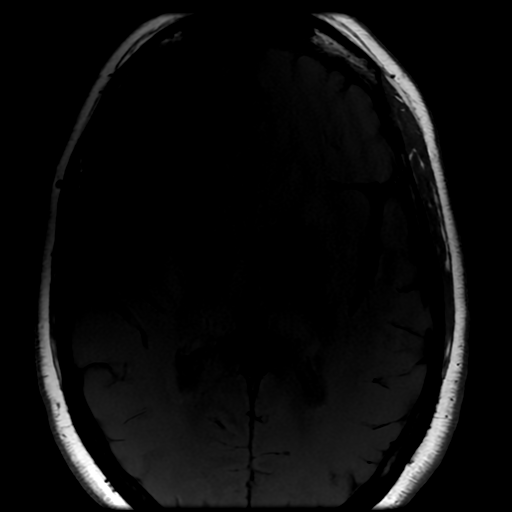

[19 of 48 positions shown; findings below may reference images not displayed]

FINDINGS: MRI HEAD FINDINGS

Brain: No acute infarct, acute hemorrhage or extra-axial collection.
Multifocal hyperintense T2-weighted signal within the white matter.
Normal volume of CSF spaces. No chronic microhemorrhage. Normal
midline structures. There is no abnormal contrast enhancement.

Vascular: Normal flow voids.

Skull and upper cervical spine: Normal marrow signal.

Other: None.

MRI ORBITS FINDINGS

Orbits: No traumatic or inflammatory finding. Globes, optic nerves,
orbital fat, extraocular muscles, vascular structures, and lacrimal
glands are normal.

Visualized sinuses: Clear.

Soft tissues: Negative.
IMPRESSION: 1. Normal MRI of the orbits.
2. No acute intracranial abnormality.
3. Multifocal hyperintense T2-weighted signal within the white
matter, consistent with chronic small vessel disease.

## 2020-10-13 IMAGING — MR MR HEAD WO/W CM
8 of 10 series · 28 of 48 positions shown · IV contrast (gadavist)
Comparison: None.

CLINICAL DATA: Headache and papilledema

EXAM:
MRI HEAD AND ORBITS WITHOUT AND WITH CONTRAST
TECHNIQUE: Multiplanar, multiecho pulse sequences of the brain and surrounding
structures were obtained without and with intravenous contrast.
Multiplanar, multiecho pulse sequences of the orbits and surrounding
structures were obtained including fat saturation techniques, before
and after intravenous contrast administration.
CONTRAST:  10mL GADAVIST GADOBUTROL 1 MMOL/ML IV SOLN

[Series 2: FLAIR · sagittal · 5.0mm · 0.47mm/px · 2 of 26 slices shown (1 of 2)]
[im 1/26]
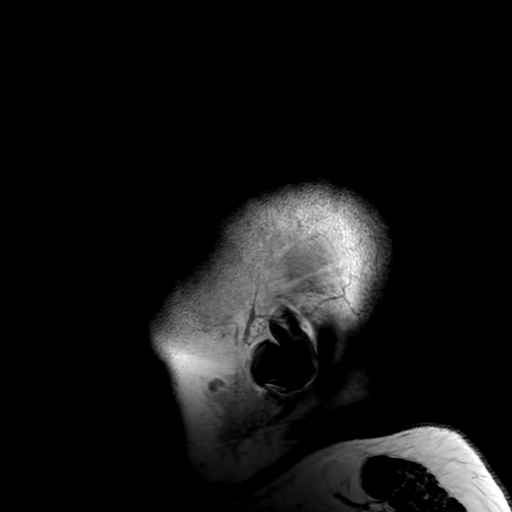
[im 26/26]
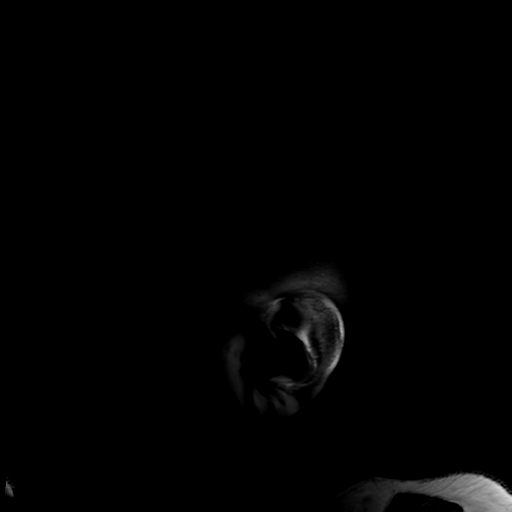

[Series 3: DWI · axial · 3.0mm · 0.94mm/px · z∈[-71,+82]mm · 8 of 104 slices shown]
[im 1/104]
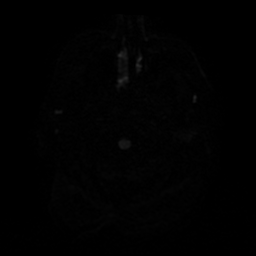
[im 12/104]
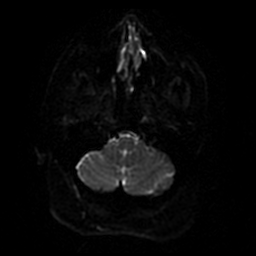
[im 35/104]
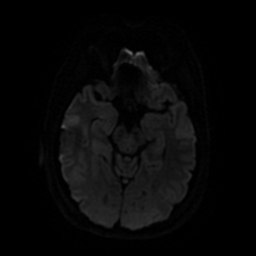
[im 46/104]
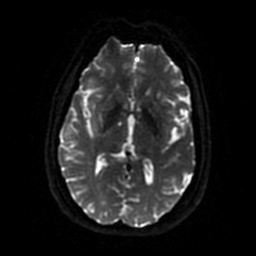
[im 58/104]
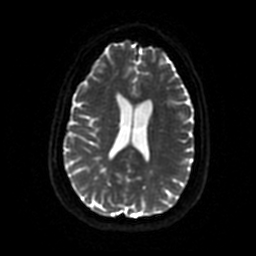
[im 69/104]
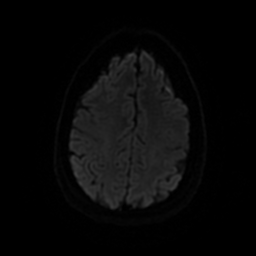
[im 92/104]
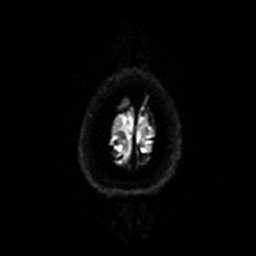
[im 104/104]
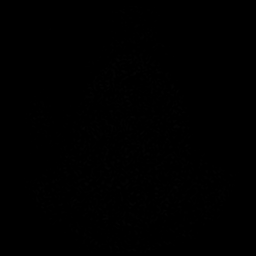

[Series 4: T2 · axial · 5.0mm · 0.47mm/px · z∈[-76,+79]mm · 3 of 27 slices shown]
[im 1/27]
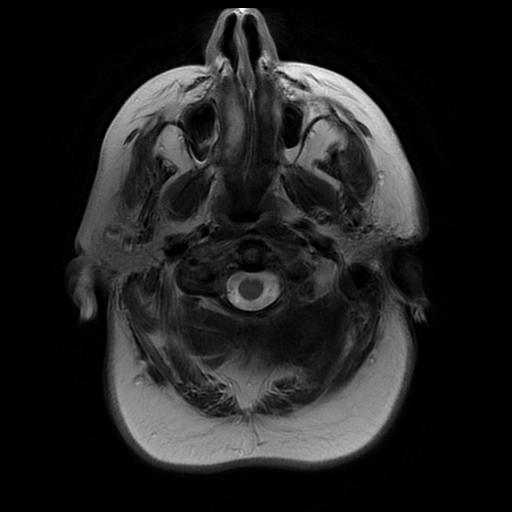
[im 14/27]
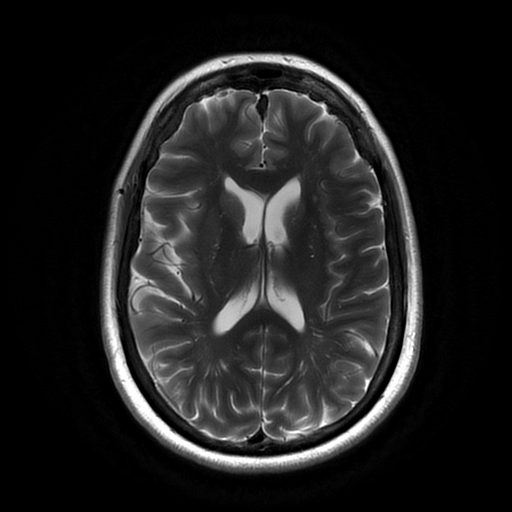
[im 27/27]
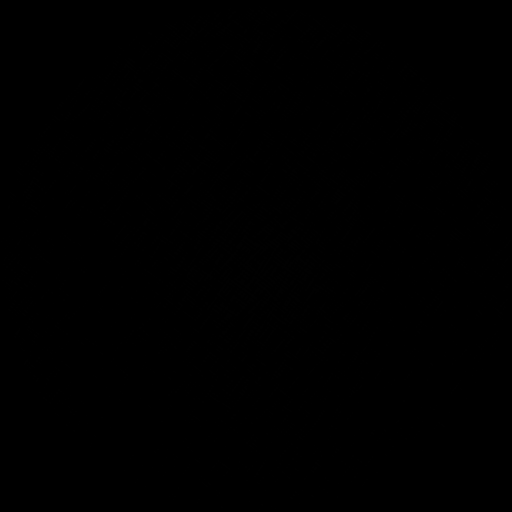

[Series 5: FLAIR · axial · 3.0mm · 0.45mm/px · z∈[-82,+68]mm · 2 of 26 slices shown (2 of 2)]
[im 1/26]
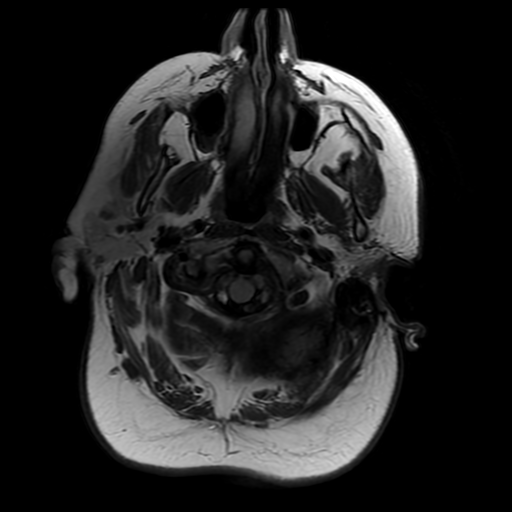
[im 26/26]
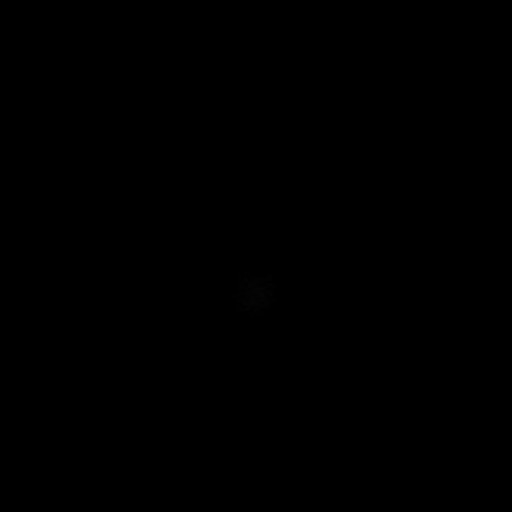

[Series 6: SWI · axial · 3.0mm · 0.50mm/px · z∈[-69,-52]mm · 2 of 104 slices shown]
[im 1/104]
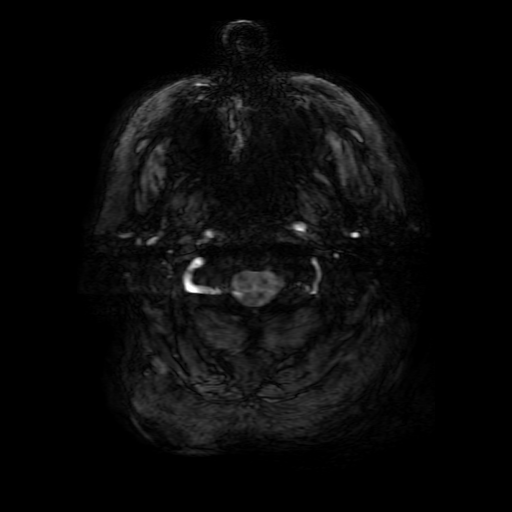
[im 12/104]
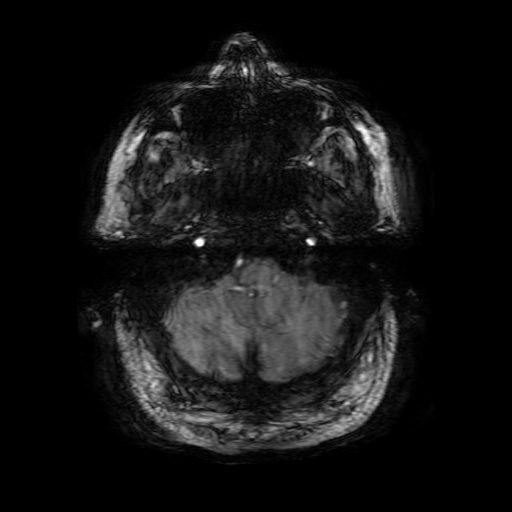

[Series 15: T2 post-contrast · coronal · 5.0mm · 0.39mm/px · 3 of 31 slices shown]
[im 1/31]
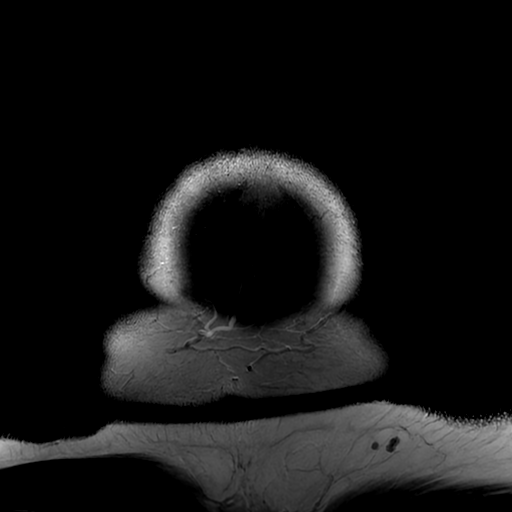
[im 16/31]
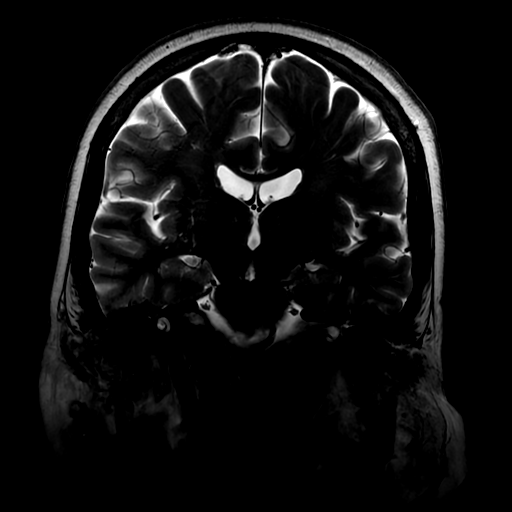
[im 31/31]
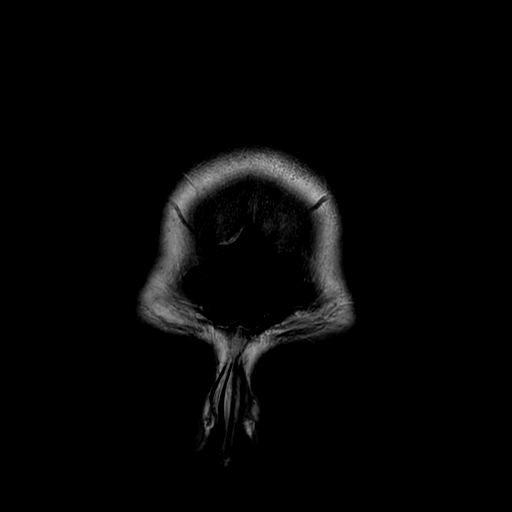

[Series 16: T1 post-contrast · coronal · 5.0mm · 0.39mm/px · 3 of 31 slices shown]
[im 1/31]
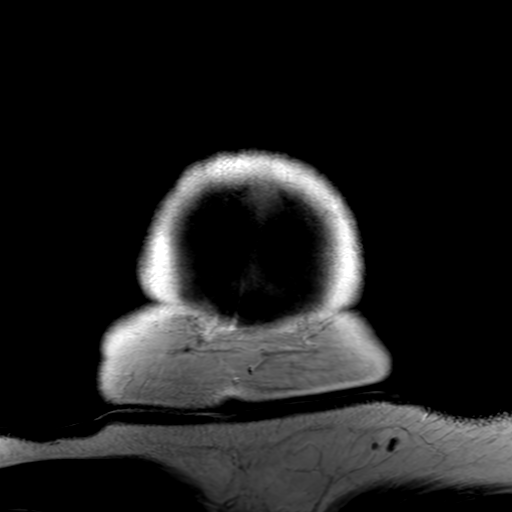
[im 16/31]
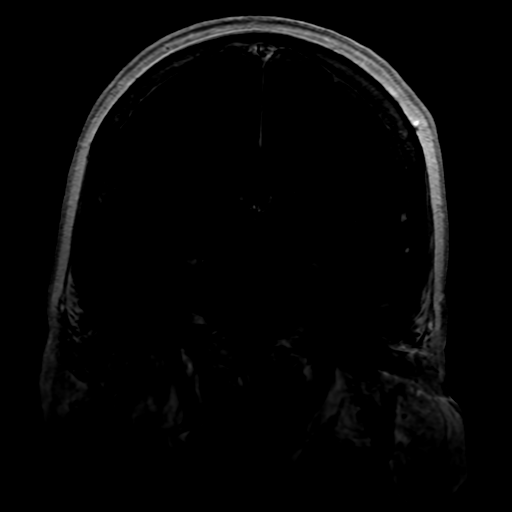
[im 31/31]
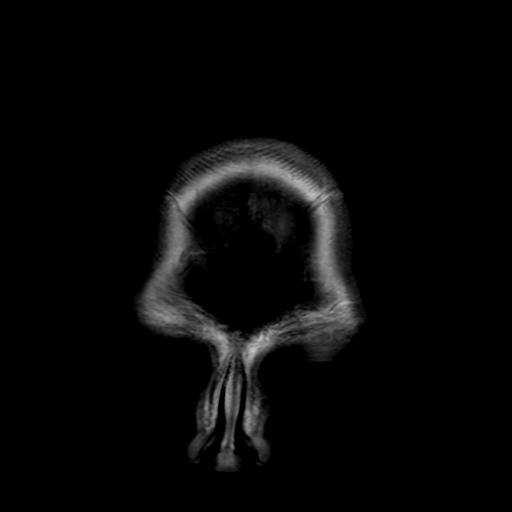

[Series 350: ADC · axial · 3.0mm · 0.94mm/px · z∈[-71,+82]mm · 5 of 52 slices shown]
[im 1/52]
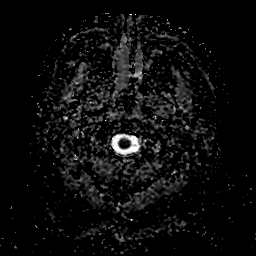
[im 13/52]
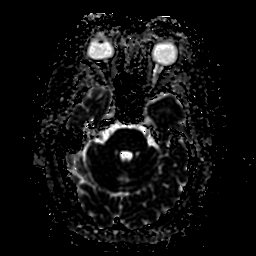
[im 26/52]
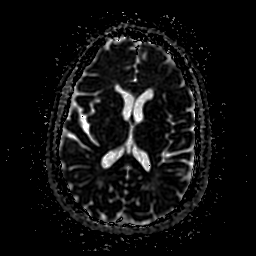
[im 39/52]
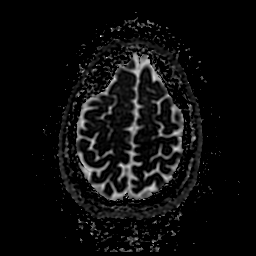
[im 52/52]
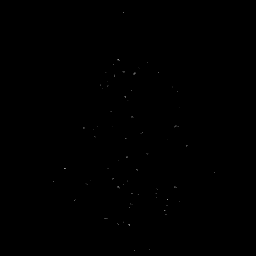

[28 of 48 positions shown; findings below may reference images not displayed]

FINDINGS: MRI HEAD FINDINGS

Brain: No acute infarct, acute hemorrhage or extra-axial collection.
Multifocal hyperintense T2-weighted signal within the white matter.
Normal volume of CSF spaces. No chronic microhemorrhage. Normal
midline structures. There is no abnormal contrast enhancement.

Vascular: Normal flow voids.

Skull and upper cervical spine: Normal marrow signal.

Other: None.

MRI ORBITS FINDINGS

Orbits: No traumatic or inflammatory finding. Globes, optic nerves,
orbital fat, extraocular muscles, vascular structures, and lacrimal
glands are normal.

Visualized sinuses: Clear.

Soft tissues: Negative.
IMPRESSION: 1. Normal MRI of the orbits.
2. No acute intracranial abnormality.
3. Multifocal hyperintense T2-weighted signal within the white
matter, consistent with chronic small vessel disease.

## 2020-10-13 MED ORDER — GADOBUTROL 1 MMOL/ML IV SOLN
10.0000 mL | Freq: Once | INTRAVENOUS | Status: AC | PRN
  Administered 2020-10-13: 10 mL via INTRAVENOUS

## 2020-10-13 NOTE — Discharge Instructions (Addendum)
Hemianopia  Hemianopia, also called hemiopia or hemianopsia, is partial vision loss in the right or left half of the eye. This may affect one or both eyes. It is often caused by a stroke or brain injury. This condition causes trouble seeing and can make it difficult to do everyday tasks. What are the causes? This condition may be caused by:  Stroke.  Brain or head injury.  Brain tumor.  Bleeding in the brain.  Brain surgery.  Certain medical conditions, such as multiple sclerosis. What are the signs or symptoms? This condition causes vision loss in the right or left half of the field of vision, in one or both eyes. As a result, you may:  Have problems reading or writing.  Have frequent falls or stumbles because you cannot see well.  Bump into people or things.  Knock things over frequently.  Have problems driving.  Have problems seeing or using common things.  Get surprised by people or things that seem to appear suddenly. How is this diagnosed? This condition may be diagnosed by:  A physical exam, medical history, and your symptoms.  An eye exam.  A visual field test to check your vision in each part of your eye.  Imaging tests of your brain, such as CT scan or MRI. You may be referred to an eye doctor who specializes in eye problems that are related to the brain (neuro-ophthalmologist). How is this treated? Treatment for this condition may include:  Eye exercises.  Scanning therapy. This teaches you to look side-to-side so you can see what is in your blind spot.  Vision restoration therapy (VRT). This type of therapy involves using a computer to do eye exercises to help your eyes focus.  Special devices to read or do other tasks.  Special glasses with prisms to help your vision.  Reading strategies to help you read, such as turning the text to the side or using a straight edge to follow the text. Follow these instructions at home: Lifestyle  Make changes  to your home to avoid falls and injuries. This may include: ? Removing rugs and mats from the floor. ? Removing clutter. ? Adding rails or grab bars in the bathroom.  Do not drive or use heavy machinery until your health care provider approves.  If you have trouble with basic or daily tasks such as cooking, bathing, or washing, ask for help. General instructions  Do not use any products that contain nicotine or tobacco, such as cigarettes and e-cigarettes. If you need help quitting, ask your health care provider.  Take over-the-counter and prescription medicines only as told by your health care provider.  Do exercises and therapy as directed. Use devices and glasses as told by your health care provider.  Keep all follow-up visits as told by your health care provider. This is important. Contact a health care provider if:  You lose more of your vision.  You become dizzy or you faint.  You have problems doing your eye exercises or other therapies. Get help right away if:  You have severe pain in one or both of your eyes.  You cannot see at all.  You have a seizure. Summary  Hemianopia is partial vision loss in the right or left half of the eye. This may affect one or both eyes.  This condition is often caused by a stroke or brain injury.  You may need eye exercises, therapies, special glasses, and other treatments.  If you have trouble with basic  or daily tasks such as cooking, bathing, or washing, ask for help. This information is not intended to replace advice given to you by your health care provider. Make sure you discuss any questions you have with your health care provider. Document Revised: 11/15/2017 Document Reviewed: 03/11/2017 Elsevier Patient Education  2020 [employer].

## 2020-10-13 NOTE — ED Notes (Signed)
Pt transported to MRI 

## 2020-10-13 NOTE — ED Triage Notes (Signed)
Pt send to the ER from eye doctor for c/o right eye vision loss for the past 10 days getting worse today Pt states that its like looking through lace. Having some HA, pt is AO x 4 no neuro deficit noticed.

## 2020-10-13 NOTE — ED Provider Notes (Addendum)
[facility] EMERGENCY DEPARTMENT Provider Note   CSN: [id number] Arrival date & time: 10/13/20  1603     History Chief Complaint  Patient presents with  . Loss of Vision    Sandy Reed is a 59 y.o. female.  The history is provided by the patient and medical records.  Eye Problem Location:  Right eye Severity:  Severe Onset quality:  Gradual Duration:  10 days Timing:  Constant Progression:  Worsening Chronicity:  New Context comment:  Sent from opthalmology office for optic nerve edema  Ineffective treatments:  None tried Associated symptoms: decreased vision and headaches   Associated symptoms: no nausea, no numbness, no photophobia, no redness, no swelling, no tearing and no vomiting        Past Medical History:  Diagnosis Date  . Hypertension   . Obesity     Patient Active Problem List   Diagnosis Date Noted  . OBESITY, MORBID 08/05/2009  . HYPERCHOLESTEROLEMIA, BORDERLINE 05/24/2009  . OVERWEIGHT 05/24/2009  . UNS ADVRS EFF UNS RX MEDICINAL&BIOLOGICAL SBSTNC 05/24/2009  . HYPERTENSION 08/25/2007    History reviewed. No pertinent surgical history.   OB History   No obstetric history on file.     No family history on file.  Social History   Tobacco Use  . Smoking status: Never Smoker  Substance Use Topics  . Alcohol use: No  . Drug use: No    Home Medications Prior to Admission medications   Medication Sig Start Date End Date Taking? Authorizing Provider  benazepril-hydrochlorthiazide (LOTENSIN HCT) 10-12.5 MG tablet Take 1 tablet by mouth daily. 08/26/20  Yes [provider]  verapamil (CALAN-SR) 240 MG CR tablet TAKE ONE TABLET BY MOUTH ONCE DAILY    Ricard Dillon, MD    Allergies    Patient has no known allergies.  Review of Systems   Review of Systems  Constitutional: Negative for chills and fever.  HENT: Negative for congestion, sore throat and trouble swallowing.   Eyes: Positive for visual  disturbance. Negative for photophobia, pain and redness.  Respiratory: Negative for cough and shortness of breath.   Cardiovascular: Negative for chest pain.  Gastrointestinal: Negative for abdominal pain, nausea and vomiting.  Musculoskeletal: Negative for neck pain.  Neurological: Positive for headaches. Negative for numbness.  All other systems reviewed and are negative.   Physical Exam Updated Vital Signs BP 139/73 (BP Location: Right Arm)   Pulse 87   Temp 98 F (36.7 C) (Oral)   Resp 16   Ht 5' 7"  (1.702 m)   Wt (!) 145.2 kg   SpO2 100%   BMI 50.12 kg/m   Physical Exam Vitals reviewed.  Constitutional:      General: She is not in acute distress.    Appearance: Normal appearance.  HENT:     Head: Normocephalic and atraumatic.     Nose: Nose normal.     Mouth/Throat:     Mouth: Mucous membranes are moist.     Pharynx: Oropharynx is clear.  Eyes:     Conjunctiva/sclera: Conjunctivae normal.     Comments: Bilateral pupils dilated 80m Visual acuity- L 20/50, R unable to read  Cardiovascular:     Rate and Rhythm: Normal rate.     Heart sounds: Normal heart sounds.  Pulmonary:     Effort: Pulmonary effort is normal.     Breath sounds: Normal breath sounds.  Abdominal:     General: Abdomen is flat.     Palpations: Abdomen  is soft.     Tenderness: There is no abdominal tenderness.  Musculoskeletal:     Right lower leg: No edema.     Left lower leg: No edema.  Skin:    General: Skin is warm and dry.  Neurological:     Mental Status: She is alert.     Motor: No weakness.  Psychiatric:        Mood and Affect: Mood normal.        Behavior: Behavior normal.     ED Results / Procedures / Treatments   Labs (all labs ordered are listed, but only abnormal results are displayed) Labs Reviewed  C-REACTIVE PROTEIN - Abnormal; Notable for the following components:      Result Value   CRP 2.0 (*)    All other components within normal limits  CBC WITH  DIFFERENTIAL/PLATELET  SEDIMENTATION RATE  BASIC METABOLIC PANEL  BASIC METABOLIC PANEL    EKG None  Radiology MR Brain W and Wo Contrast  Result Date: 10/13/2020 CLINICAL DATA:  Headache and papilledema EXAM: MRI HEAD AND ORBITS WITHOUT AND WITH CONTRAST TECHNIQUE: Multiplanar, multiecho pulse sequences of the brain and surrounding structures were obtained without and with intravenous contrast. Multiplanar, multiecho pulse sequences of the orbits and surrounding structures were obtained including fat saturation techniques, before and after intravenous contrast administration. CONTRAST:  46m GADAVIST GADOBUTROL 1 MMOL/ML IV SOLN COMPARISON:  None. FINDINGS: MRI HEAD FINDINGS Brain: No acute infarct, acute hemorrhage or extra-axial collection. Multifocal hyperintense T2-weighted signal within the white matter. Normal volume of CSF spaces. No chronic microhemorrhage. Normal midline structures. There is no abnormal contrast enhancement. Vascular: Normal flow voids. Skull and upper cervical spine: Normal marrow signal. Other: None. MRI ORBITS FINDINGS Orbits: No traumatic or inflammatory finding. Globes, optic nerves, orbital fat, extraocular muscles, vascular structures, and lacrimal glands are normal. Visualized sinuses: Clear. Soft tissues: Negative. IMPRESSION: 1. Normal MRI of the orbits. 2. No acute intracranial abnormality. 3. Multifocal hyperintense T2-weighted signal within the white matter, consistent with chronic small vessel disease. Electronically Signed   By: KUlyses JarredM.D.   On: 10/13/2020 23:05   MR ORBITS W WO CONTRAST  Result Date: 10/13/2020 CLINICAL DATA:  Headache and papilledema EXAM: MRI HEAD AND ORBITS WITHOUT AND WITH CONTRAST TECHNIQUE: Multiplanar, multiecho pulse sequences of the brain and surrounding structures were obtained without and with intravenous contrast. Multiplanar, multiecho pulse sequences of the orbits and surrounding structures were obtained including fat  saturation techniques, before and after intravenous contrast administration. CONTRAST:  171mGADAVIST GADOBUTROL 1 MMOL/ML IV SOLN COMPARISON:  None. FINDINGS: MRI HEAD FINDINGS Brain: No acute infarct, acute hemorrhage or extra-axial collection. Multifocal hyperintense T2-weighted signal within the white matter. Normal volume of CSF spaces. No chronic microhemorrhage. Normal midline structures. There is no abnormal contrast enhancement. Vascular: Normal flow voids. Skull and upper cervical spine: Normal marrow signal. Other: None. MRI ORBITS FINDINGS Orbits: No traumatic or inflammatory finding. Globes, optic nerves, orbital fat, extraocular muscles, vascular structures, and lacrimal glands are normal. Visualized sinuses: Clear. Soft tissues: Negative. IMPRESSION: 1. Normal MRI of the orbits. 2. No acute intracranial abnormality. 3. Multifocal hyperintense T2-weighted signal within the white matter, consistent with chronic small vessel disease. Electronically Signed   By: KeUlyses Jarred.D.   On: 10/13/2020 23:05    Procedures Procedures (including critical care time)  Medications Ordered in ED Medications  gadobutrol (GADAVIST) 1 MMOL/ML injection 10 mL (10 mLs Intravenous Contrast Given 10/13/20 2224)    ED Course  I have reviewed the triage vital signs and the nursing notes.  Pertinent labs & imaging results that were available during my care of the patient were reviewed by me and considered in my medical decision making (see chart for details).    MDM Rules/Calculators/A&P                          Medical Decision Making: CAILYNN BODNAR is a 59 y.o. female who presented to the ED today with vision loss, sent from opthalmology office for MRI. Pt reports 10 days of partial vision loss in R eye, like "looking through lace" in mid visual field toward her nose, only in R eye. Pt reports yesterday mild R sided headache, improved with tylenol. No frequent or severe headaches.  Pt reportedly  had CT head negative at [facility] yesterday. 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  Reviewed and confirmed nursing documentation for past medical history, family history, social history.  On my initial exam, the pt was in NAD, resting comfortably, pupil dilation bilaterally.   Consulted: Neurology Will obtain CBC, BMP, CRP, ESR   Will obtain MRI brain and orbits w and wo  ESR 17, CRP 2.  Do not suspect temporal arteritis at this time, given no temporal tenderness and normal Sed rate.  CBC and CMP unremarkable.  No evidence of infection, afebrile, no leukocytosis.    MRI unremarkable, report shows normal orbits and no acute intracranial abnormality. Multifocal hyperintense T2-weighted signal within the white matter, consistent with chronic small vessel disease.    All radiology and laboratory studies reviewed independently and with my attending physician, agree with reading provided by radiologist unless otherwise noted.   Upon reassessing patient, patient was resting comfortably, NAD.  Neurology contacted regarding results of MRI, they will evaluate the pt and give recommendations.   Based on the above findings, I believe patient is hemodynamically stable for discharge.  Patient/and family educated about specific return precautions for given chief complaint and symptoms.  Patient/and family educated about follow-up with PCP.  Patient/and family expressed understanding of return precautions and need for follow-up.  Patient discharged.   Handoff given to Dr. Roxanne Mins, ED Attending, at 11:30 pm. Will follow up with Neurology regarding follow up recommendations. Will plan to dc and Anticipate ophthalmology follow up.    The above care was  discussed with and agreed upon by my attending physician, Dr Rex Kras.  Emergency Department Medication Summary:  Medications  gadobutrol (GADAVIST) 1 MMOL/ML injection 10 mL (10 mLs Intravenous Contrast Given 10/13/20 2224)     Final Clinical Impression(s) / ED Diagnoses Final diagnoses:  Visual loss    Rx / DC Orders ED Discharge Orders    None       Roosevelt Locks, MD 10/13/20 Minus Breeding    Roosevelt Locks, MD 10/14/20 0000    Rex Kras Wenda Overland, MD 10/17/20 [phone]

## 2020-10-14 ENCOUNTER — Observation Stay (HOSPITAL_COMMUNITY): Payer: Managed Care, Other (non HMO)

## 2020-10-14 ENCOUNTER — Encounter (HOSPITAL_COMMUNITY): Payer: Self-pay | Admitting: Internal Medicine

## 2020-10-14 ENCOUNTER — Observation Stay (HOSPITAL_BASED_OUTPATIENT_CLINIC_OR_DEPARTMENT_OTHER): Payer: Managed Care, Other (non HMO)

## 2020-10-14 DIAGNOSIS — I10 Essential (primary) hypertension: Secondary | ICD-10-CM | POA: Diagnosis not present

## 2020-10-14 DIAGNOSIS — H3411 Central retinal artery occlusion, right eye: Secondary | ICD-10-CM | POA: Diagnosis present

## 2020-10-14 DIAGNOSIS — I6389 Other cerebral infarction: Secondary | ICD-10-CM

## 2020-10-14 DIAGNOSIS — H34231 Retinal artery branch occlusion, right eye: Secondary | ICD-10-CM | POA: Diagnosis present

## 2020-10-14 DIAGNOSIS — H53451 Other localized visual field defect, right eye: Secondary | ICD-10-CM | POA: Diagnosis not present

## 2020-10-14 DIAGNOSIS — I639 Cerebral infarction, unspecified: Secondary | ICD-10-CM

## 2020-10-14 LAB — CBC
HCT: 41.6 % (ref 36.0–46.0)
Hemoglobin: 13.4 g/dL (ref 12.0–15.0)
MCH: 30.4 pg (ref 26.0–34.0)
MCHC: 32.2 g/dL (ref 30.0–36.0)
MCV: 94.3 fL (ref 80.0–100.0)
Platelets: 272 10*3/uL (ref 150–400)
RBC: 4.41 MIL/uL (ref 3.87–5.11)
RDW: 13 % (ref 11.5–15.5)
WBC: 8 10*3/uL (ref 4.0–10.5)
nRBC: 0 % (ref 0.0–0.2)

## 2020-10-14 LAB — HEPATIC FUNCTION PANEL
ALT: 37 U/L (ref 0–44)
AST: 35 U/L (ref 15–41)
Albumin: 3.9 g/dL (ref 3.5–5.0)
Alkaline Phosphatase: 79 U/L (ref 38–126)
Bilirubin, Direct: 0.2 mg/dL (ref 0.0–0.2)
Indirect Bilirubin: 1 mg/dL — ABNORMAL HIGH (ref 0.3–0.9)
Total Bilirubin: 1.2 mg/dL (ref 0.3–1.2)
Total Protein: 7 g/dL (ref 6.5–8.1)

## 2020-10-14 LAB — RESPIRATORY PANEL BY RT PCR (FLU A&B, COVID)
Influenza A by PCR: NEGATIVE
Influenza B by PCR: NEGATIVE
SARS Coronavirus 2 by RT PCR: NEGATIVE

## 2020-10-14 LAB — LIPID PANEL
Cholesterol: 160 mg/dL (ref 0–200)
HDL: 40 mg/dL — ABNORMAL LOW (ref >40)
LDL Cholesterol: 106 mg/dL — ABNORMAL HIGH (ref 0–99)
Total CHOL/HDL Ratio: 4 RATIO
Triglycerides: 69 mg/dL (ref <150)
VLDL: 14 mg/dL (ref 0–40)

## 2020-10-14 LAB — BASIC METABOLIC PANEL WITH GFR
Anion gap: 12 (ref 5–15)
BUN: 12 mg/dL (ref 6–20)
CO2: 23 mmol/L (ref 22–32)
Calcium: 9.2 mg/dL (ref 8.9–10.3)
Chloride: 103 mmol/L (ref 98–111)
Creatinine, Ser: 0.7 mg/dL (ref 0.44–1.00)
GFR, Estimated: >60 mL/min
Glucose, Bld: 107 mg/dL — ABNORMAL HIGH (ref 70–99)
Potassium: 3.7 mmol/L (ref 3.5–5.1)
Sodium: 138 mmol/L (ref 135–145)

## 2020-10-14 LAB — HEMOGLOBIN A1C
Hgb A1c MFr Bld: 5.6 % (ref 4.8–5.6)
Mean Plasma Glucose: 114 mg/dL

## 2020-10-14 LAB — ECHOCARDIOGRAM COMPLETE
Area-P 1/2: 2.56 cm2
Height: 67 in
S' Lateral: 3.4 cm
Weight: 5120 oz

## 2020-10-14 LAB — CREATININE, SERUM
Creatinine, Ser: 0.68 mg/dL (ref 0.44–1.00)
GFR, Estimated: >60 mL/min (ref >60)

## 2020-10-14 LAB — TSH: TSH: 2.661 u[IU]/mL (ref 0.350–4.500)

## 2020-10-14 LAB — HIV ANTIBODY (ROUTINE TESTING W REFLEX): HIV Screen 4th Generation wRfx: NONREACTIVE

## 2020-10-14 IMAGING — MR MR MRA NECK WO/W CM
4 of 6 series · 19 of 48 positions shown · IV contrast (Yes GAD)
Comparison: Concurrently performed MRA of the head [DATE]. MRI
of the head and orbits [DATE].

CLINICAL DATA: Neuro deficit, acute, stroke suspected. Additional
history provided: Central retinal artery occlusion of the right
side.

EXAM:
MRA NECK WITHOUT AND WITH CONTRAST
TECHNIQUE: Multiplanar and multiecho pulse sequences of the neck were obtained
without and with intravenous contrast. Angiographic images of the
neck were obtained using MRA technique without and with intravenous
contrast.
CONTRAST:  10mL GADAVIST GADOBUTROL 1 MMOL/ML IV SOLN

[Series 400: cor cemra ft · coronal · 1.2mm · 0.59mm/px · 8 of 113 slices shown]
[im 1/113]
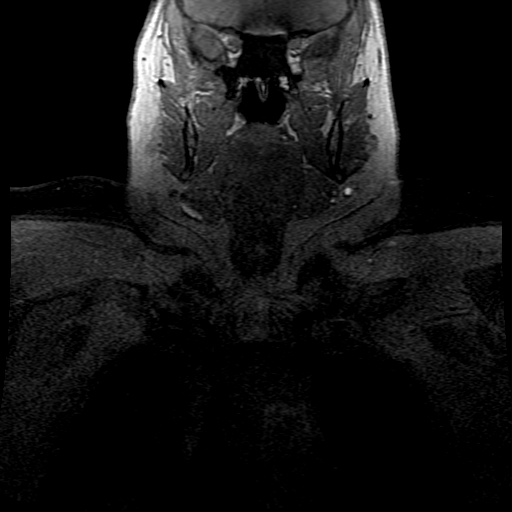
[im 17/113]
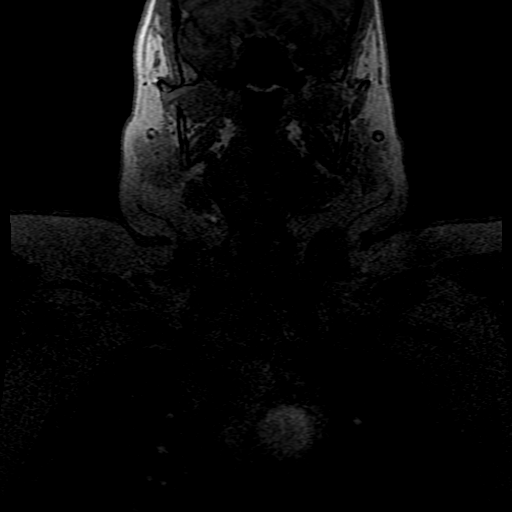
[im 33/113]
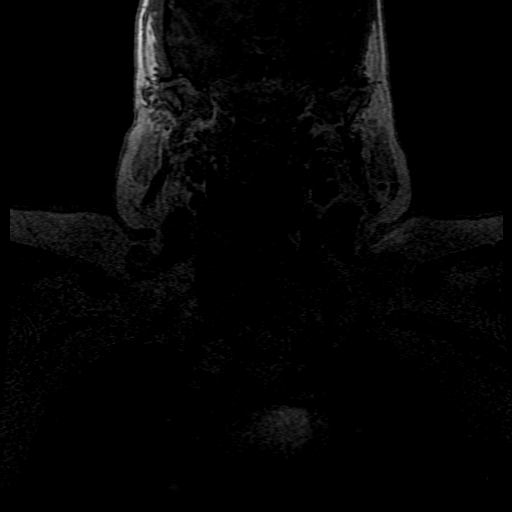
[im 49/113]
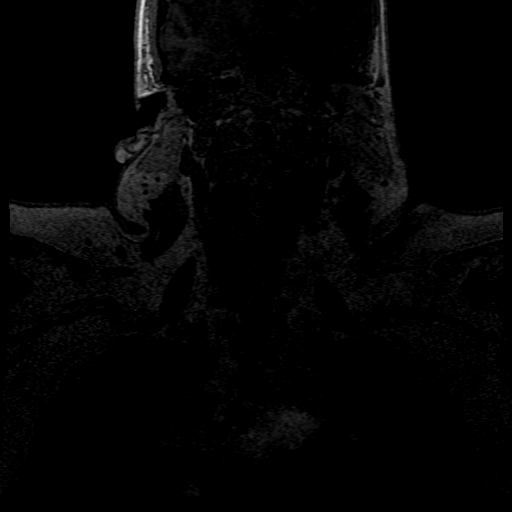
[im 65/113]
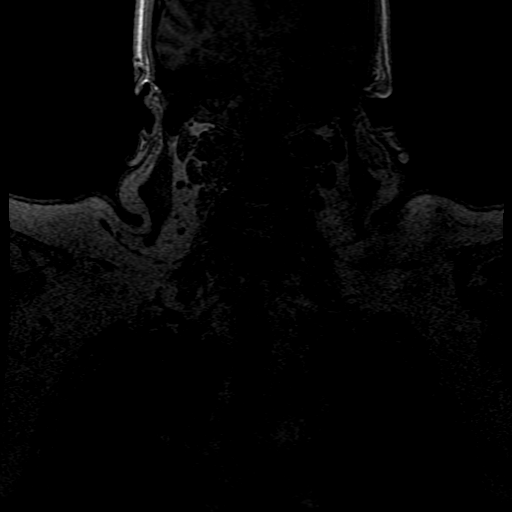
[im 81/113]
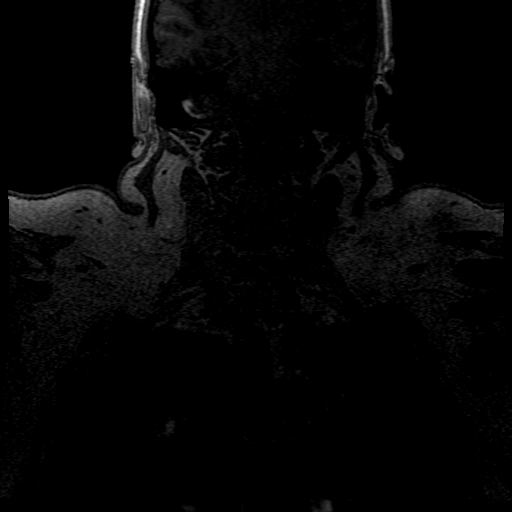
[im 97/113]
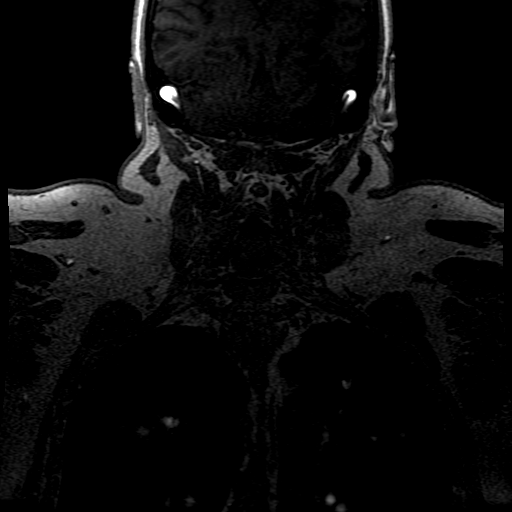
[im 113/113]
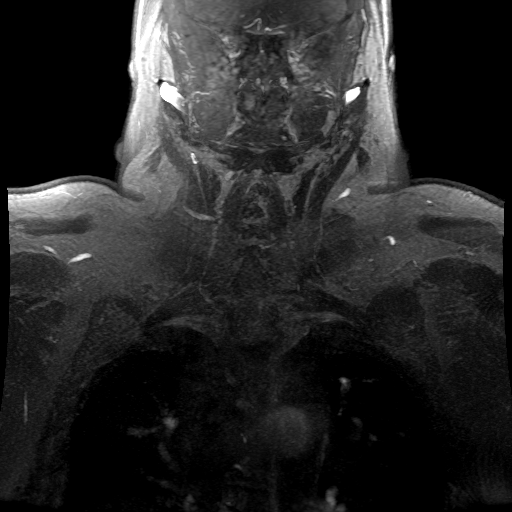

[Series 401: ph1/cor cemra ft · coronal · 1.2mm · 0.59mm/px · 5 of 112 slices shown]
[im 1/112]
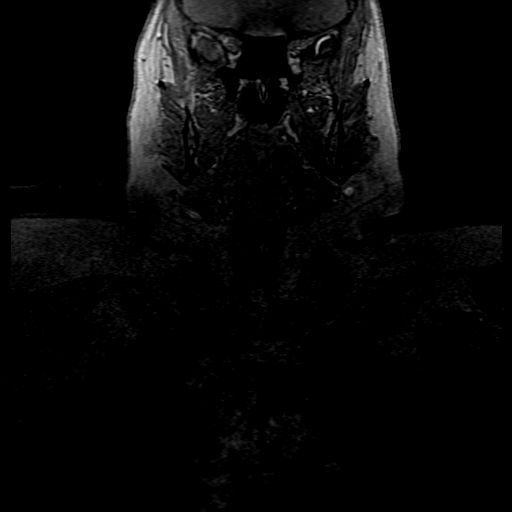
[im 16/112]
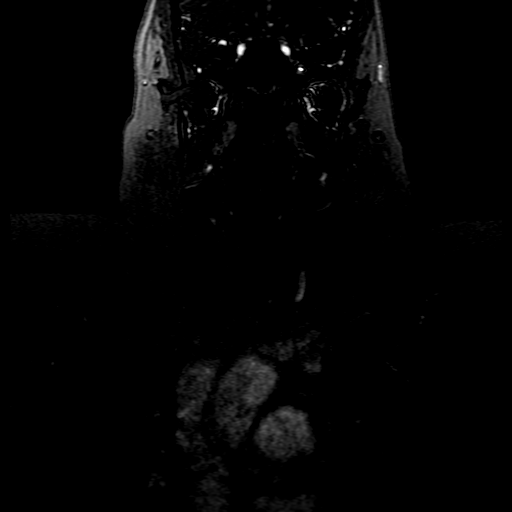
[im 32/112]
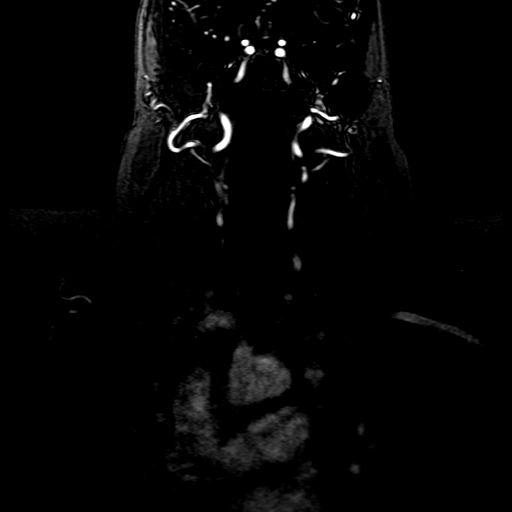
[im 64/112]
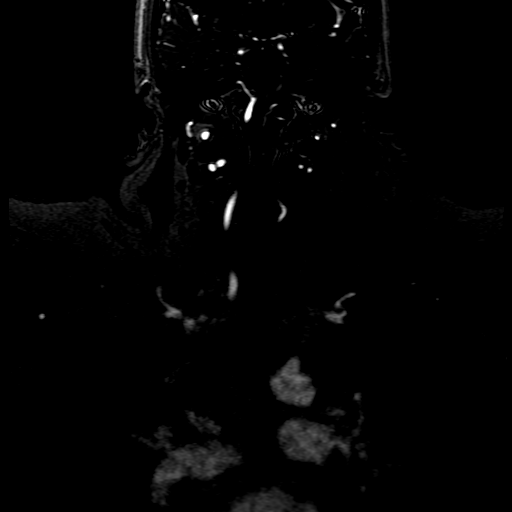
[im 96/112]
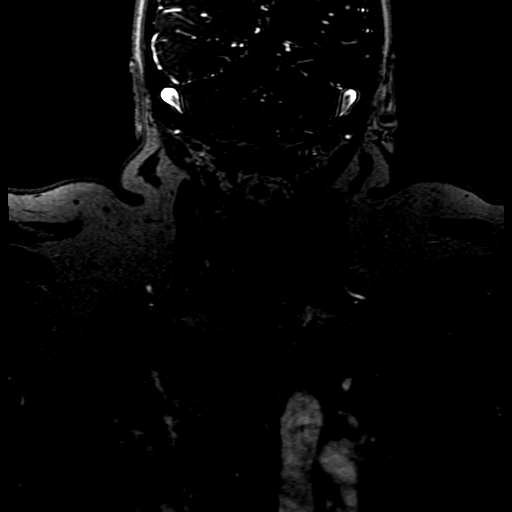

[Series 402: ph2/cor cemra ft · coronal · 1.2mm · 0.59mm/px · 3 of 113 slices shown]
[im 17/113]
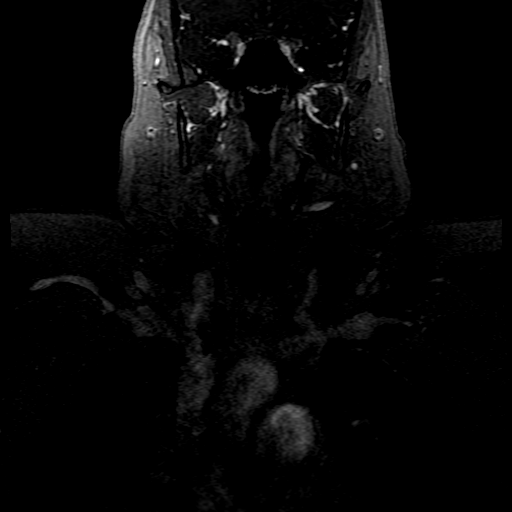
[im 65/113]
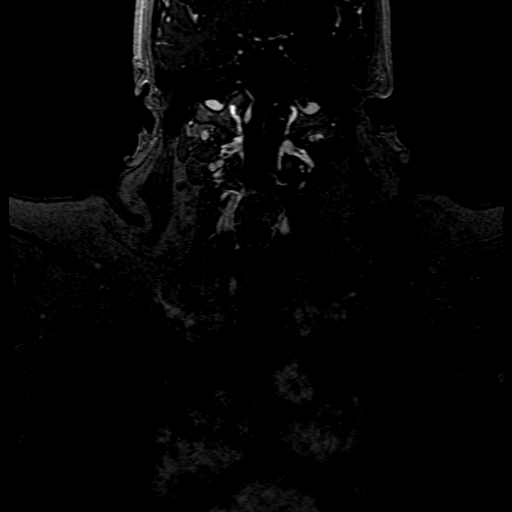
[im 97/113]
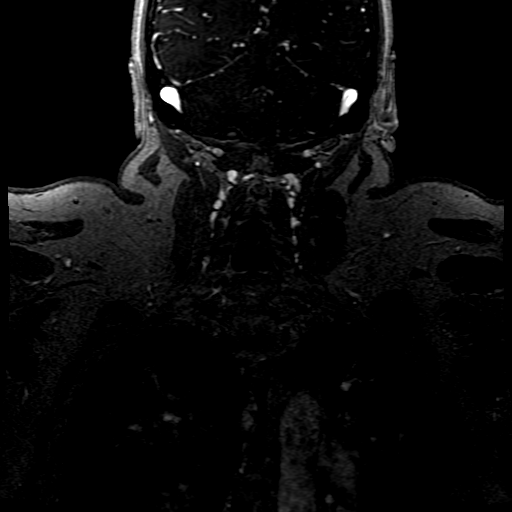

[((date))-((date)) · coronal · 1.2mm · 0.59mm/px · 3 of 113 slices shown]
[im 17/113]
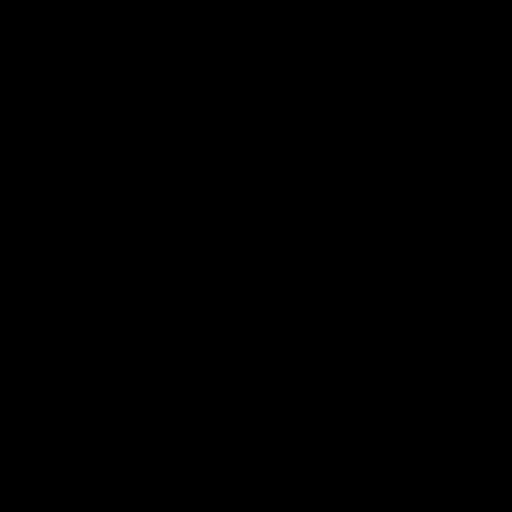
[im 65/113]
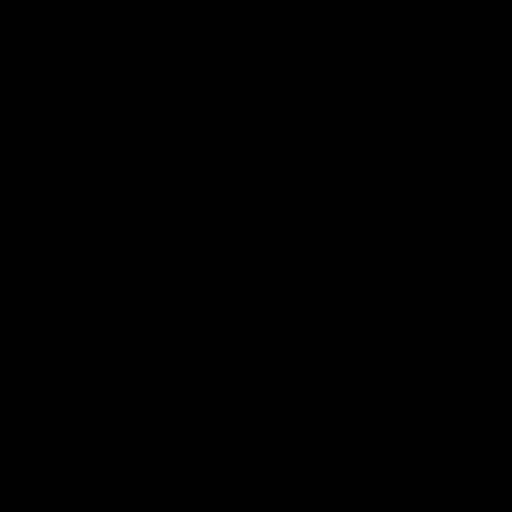
[im 97/113]
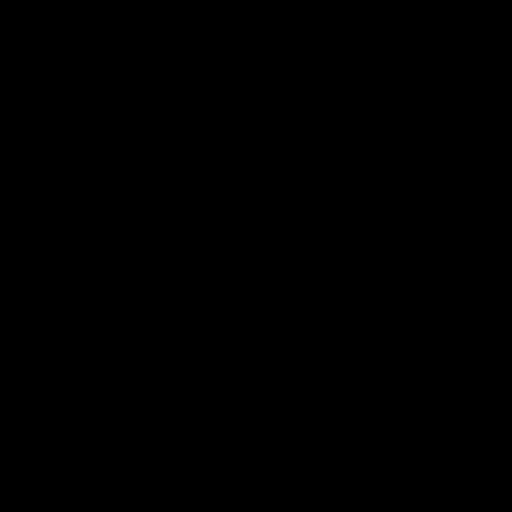

[19 of 48 positions shown; findings below may reference images not displayed]

FINDINGS: There is apparent narrowing of the proximal right CCA of up to 50%.
Distal to this, the CCA and ICA are patent within the neck without
stenosis.

The left CCA and ICA are patent within the neck without
hemodynamically significant stenosis. Mild atherosclerotic
irregularity of the proximal CCA.

The vertebral arteries are patent within the neck bilaterally with
antegrade flow. Apparent moderate narrowing at the origin of the
dominant right vertebral artery. No appreciable stenosis within the
non-dominant left vertebral artery within the neck.
IMPRESSION: Apparent narrowing of the proximal right CCA of up to 50%. However,
this apparent narrowing may be accentuated by vessel tortuosity and
mild motion degradation at this site.

The left CCA and ICA are patent within the neck without significant
stenosis. Mild atherosclerotic irregularity of the proximal left
CCA.

The bilateral vertebral arteries are patent within the neck with
antegrade flow. Apparent moderate narrowing at the origin of the
dominant right vertebral artery.

## 2020-10-14 IMAGING — MR MR MRA HEAD W/O CM
1 series · 20 of 48 positions shown · non-contrast
Comparison: None.

CLINICAL DATA: Stroke follow-up.  Brain MRI from yesterday

EXAM:
MRA HEAD WITHOUT CONTRAST
TECHNIQUE: Angiographic images of the Circle of Willis were obtained using MRA
technique without intravenous contrast.

[Series 2: ax (id) · axial · 1.0mm · 0.43mm/px · z∈[-91,-4]mm · 20 of 184 slices shown]
[im 1/184]
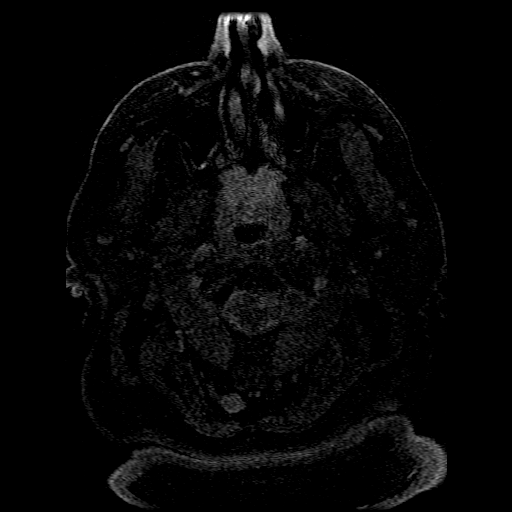
[im 4/184]
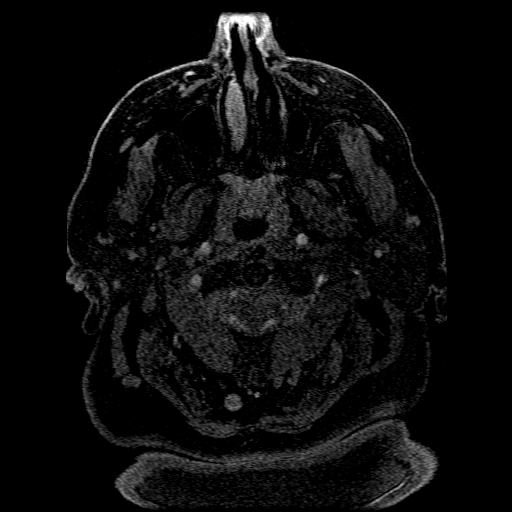
[im 8/184]
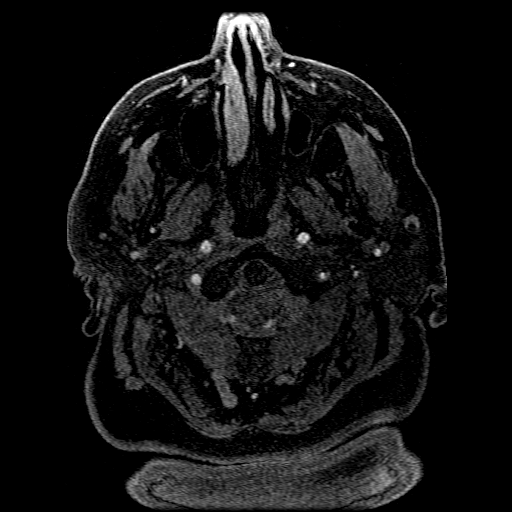
[im 12/184]
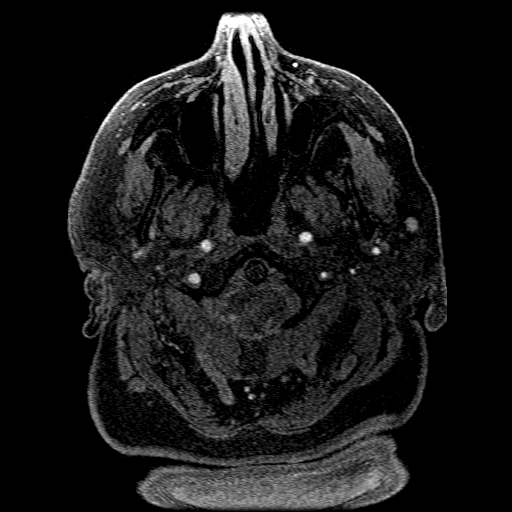
[im 16/184]
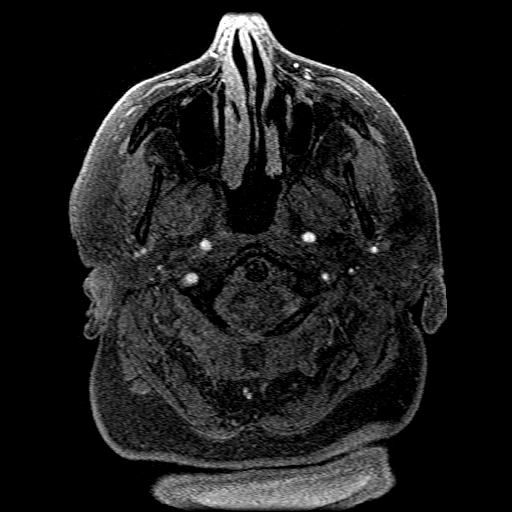
[im 20/184]
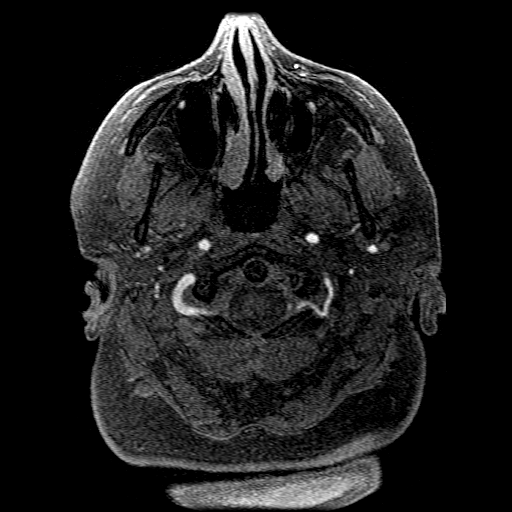
[im 24/184]
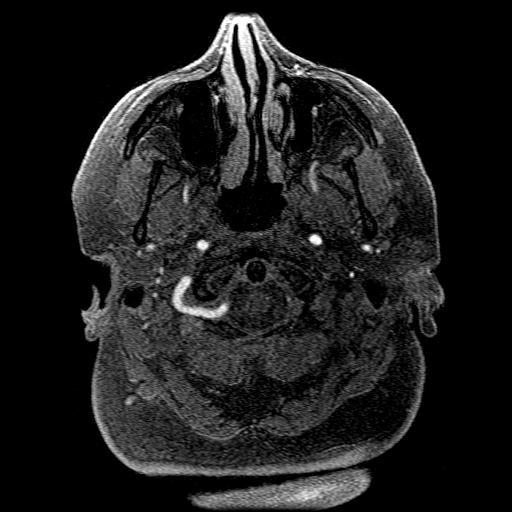
[im 28/184]
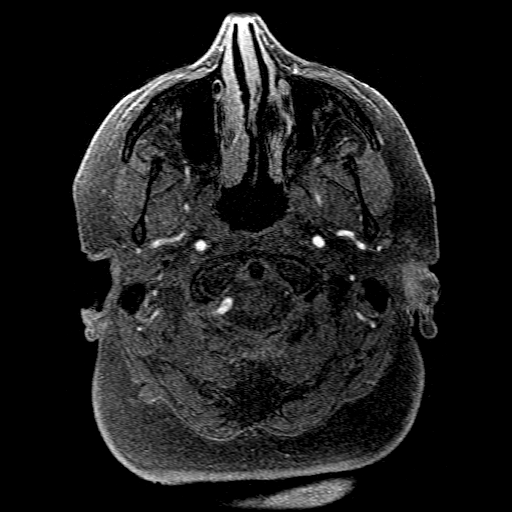
[im 32/184]
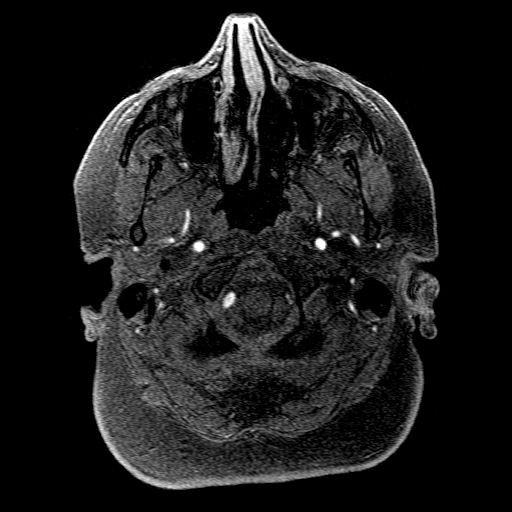
[im 36/184]
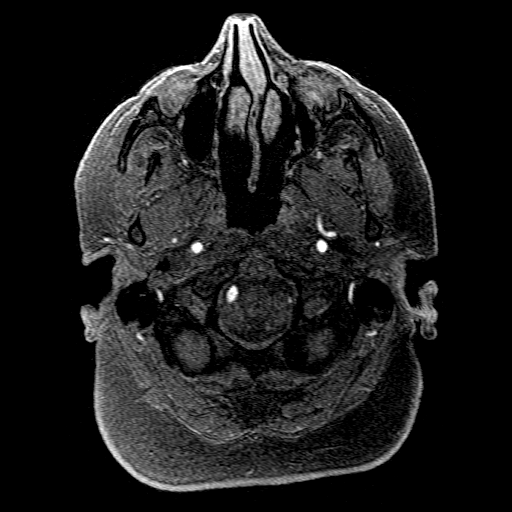
[im 39/184]
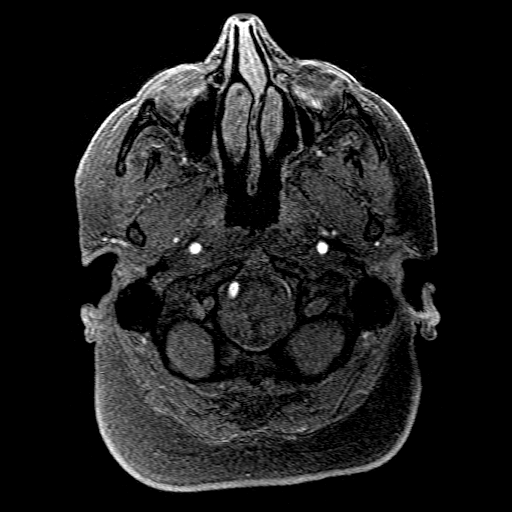
[im 43/184]
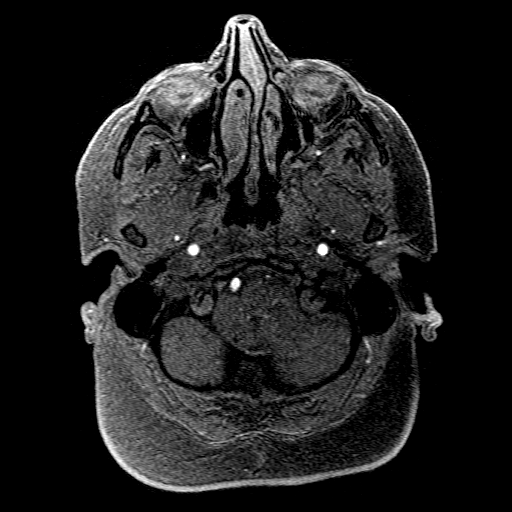
[im 59/184]
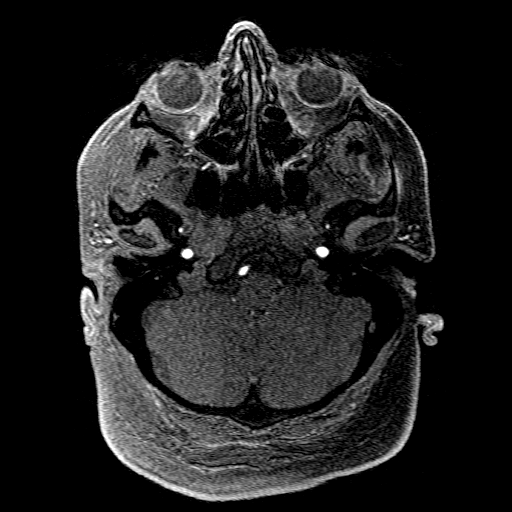
[im 82/184]
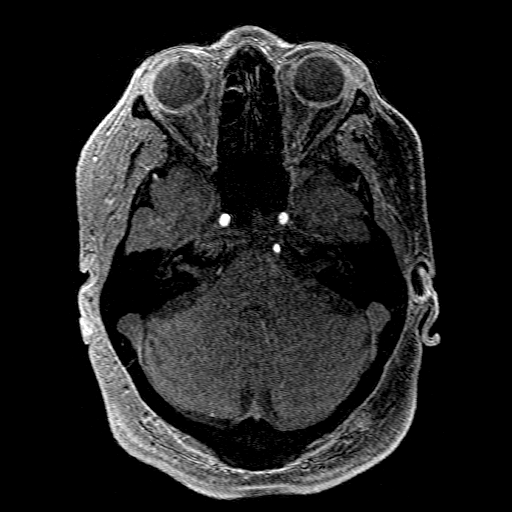
[im 94/184]
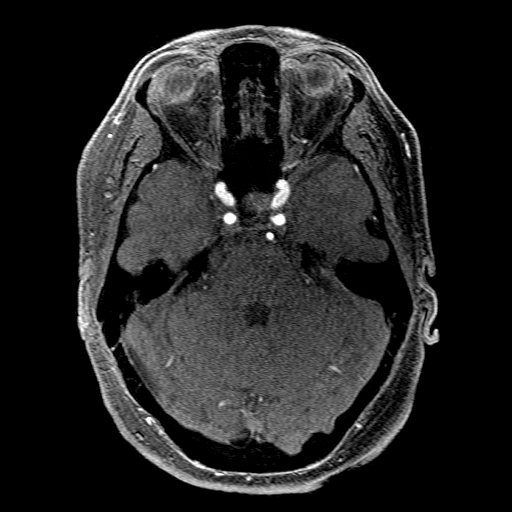
[im 106/184]
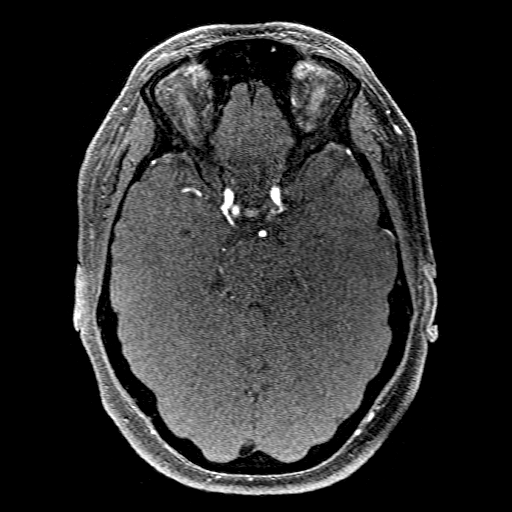
[im 129/184]
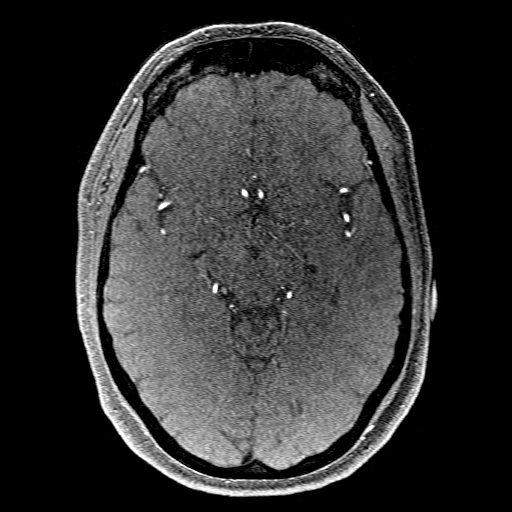
[im 152/184]
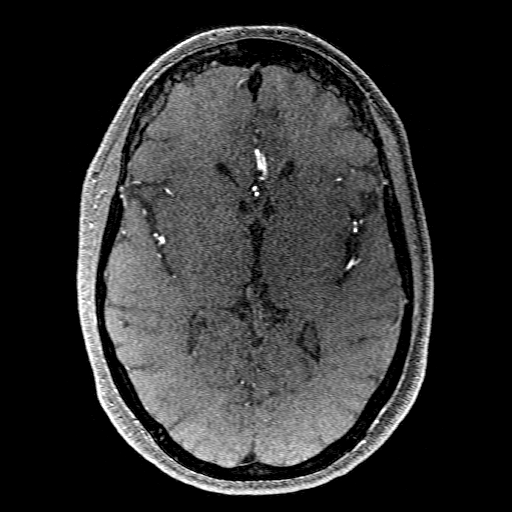
[im 156/184]
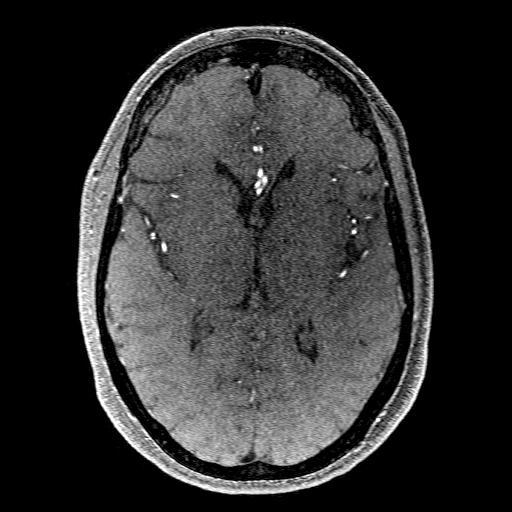
[im 176/184]
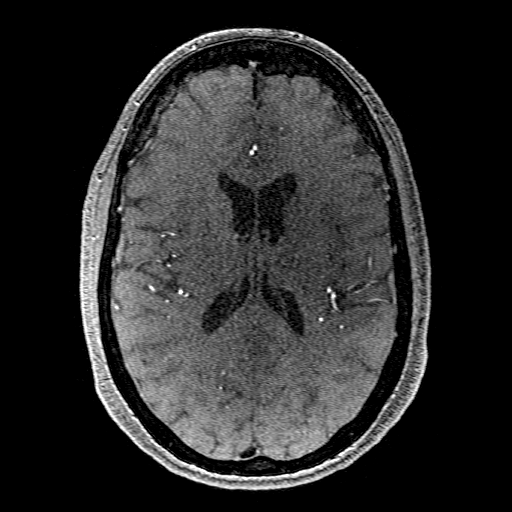

[20 of 48 positions shown; findings below may reference images not displayed]

FINDINGS: The carotid arteries are smooth and widely patent. Early branching
right MCA. No stenosis, beading, or aneurysm. The left vertebral
artery is hypoplastic and terminates in the PICA. The right
vertebral and basilar arteries are smooth and widely patent. No PCA
branch occlusion or stenosis. Negative for aneurysm
IMPRESSION: Normal intracranial MRA.

## 2020-10-14 MED ORDER — PERFLUTREN LIPID MICROSPHERE
1.0000 mL | INTRAVENOUS | Status: AC | PRN
  Administered 2020-10-14: 2 mL via INTRAVENOUS
  Filled 2020-10-14: qty 10

## 2020-10-14 MED ORDER — ACETAMINOPHEN 650 MG RE SUPP: 650.0000 mg | RECTAL | Status: DC | PRN

## 2020-10-14 MED ORDER — ATORVASTATIN CALCIUM 80 MG PO TABS
80.0000 mg | ORAL_TABLET | Freq: Every day | ORAL | Status: DC
  Administered 2020-10-14: 80 mg via ORAL
  Filled 2020-10-14: qty 1

## 2020-10-14 MED ORDER — ASPIRIN EC 81 MG PO TBEC
81.0000 mg | DELAYED_RELEASE_TABLET | Freq: Every day | ORAL | Status: DC
  Administered 2020-10-14: 81 mg via ORAL
  Filled 2020-10-14: qty 1

## 2020-10-14 MED ORDER — STROKE: EARLY STAGES OF RECOVERY BOOK: Freq: Once | Status: DC

## 2020-10-14 MED ORDER — ASPIRIN 81 MG PO TBEC: 81.0000 mg | DELAYED_RELEASE_TABLET | Freq: Every day | ORAL | 11 refills | Status: AC

## 2020-10-14 MED ORDER — GADOBUTROL 1 MMOL/ML IV SOLN
10.0000 mL | Freq: Once | INTRAVENOUS | Status: AC | PRN
  Administered 2020-10-14: 10 mL via INTRAVENOUS

## 2020-10-14 MED ORDER — CLOPIDOGREL BISULFATE 75 MG PO TABS
75.0000 mg | ORAL_TABLET | Freq: Every day | ORAL | Status: DC
  Administered 2020-10-14: 75 mg via ORAL
  Filled 2020-10-14: qty 1

## 2020-10-14 MED ORDER — BENAZEPRIL HCL 5 MG PO TABS
10.0000 mg | ORAL_TABLET | Freq: Every day | ORAL | Status: DC
  Administered 2020-10-14: 10 mg via ORAL
  Filled 2020-10-14: qty 2

## 2020-10-14 MED ORDER — ACETAMINOPHEN 160 MG/5ML PO SOLN: 650.0000 mg | ORAL | Status: DC | PRN

## 2020-10-14 MED ORDER — HYDROCHLOROTHIAZIDE 12.5 MG PO CAPS
12.5000 mg | ORAL_CAPSULE | Freq: Every day | ORAL | Status: DC
  Administered 2020-10-14: 12.5 mg via ORAL
  Filled 2020-10-14: qty 1

## 2020-10-14 MED ORDER — BENAZEPRIL-HYDROCHLOROTHIAZIDE 10-12.5 MG PO TABS: 1.0000 | ORAL_TABLET | Freq: Every day | ORAL | Status: DC

## 2020-10-14 MED ORDER — ACETAMINOPHEN 325 MG PO TABS: 650.0000 mg | ORAL_TABLET | ORAL | Status: DC | PRN

## 2020-10-14 MED ORDER — ENOXAPARIN SODIUM 40 MG/0.4ML ~~LOC~~ SOLN
40.0000 mg | SUBCUTANEOUS | Status: DC
  Administered 2020-10-14: 40 mg via SUBCUTANEOUS
  Filled 2020-10-14: qty 0.4

## 2020-10-14 NOTE — Progress Notes (Signed)
SLP Cancellation Note  Patient Details Name: Sandy Reed MRN: [id number] DOB: [birth year]   Cancelled treatment:       Reason Eval/Treat Not Completed: SLP screened, no needs identified, will sign off   Tressie Stalker, M.S., CCC-SLP 10/14/2020, 2:06 PM

## 2020-10-14 NOTE — ED Notes (Signed)
Report given to Monica, RN.

## 2020-10-14 NOTE — H&P (Addendum)
Consult H&P  CC: monocular visual loss.  History is obtained from: patient and outside medical records  HPI: Sandy Reed is a 59 y.o. female referred to ED by ophthalmologist for MRI due to 10 days of slowly progressing viusousl loss in her right eye. Described as if she was "looking through a [dark grey-translucent] piece of lace" through the central filed of her right eye with the adjacent nasal field "like everything is cut off". It started off as black spots floating which gradually expanded.  CT head [place] yesterday (-).  Opthothalmology visit today showed optic nerve edema.  Past medical history significant for HTN, migraines, obesity. 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  Reviewed and confirmed nursing documentation for past medical history, family history, social history.  Denies pain with eye movement.  Denies headache, dizziness, slurred speech, focal weakness, N/V, intercurrent illness.  LKW: 10 days ago tpa given?: No,  IR Thrombectomy? No,  Modified Rankin Scale: 0-Completely asymptomatic and back to baseline post- stroke NIHSS: 1  ROS: A complete ROS was performed and is negative except as noted in the HPI.    Past Medical History:  Diagnosis Date  . Hypertension   . Obesity    Significant maternal history of cardiac murmurs  Social History:  reports that she has never smoked. She does not have any smokeless tobacco history on file. She reports that she does not drink alcohol and does not use drugs.   Prior to Admission medications   Medication Sig Start Date End Date Taking? Authorizing Provider  benazepril-hydrochlorthiazide (LOTENSIN HCT) 10-12.5 MG tablet Take 1 tablet by mouth daily. 08/26/20  Yes [provider]  verapamil (CALAN-SR) 240 MG CR tablet TAKE ONE TABLET BY MOUTH ONCE DAILY    Jenkins, John E, MD   Exam: Current vital  signs: BP 133/85   Pulse 78   Temp 98 F (36.7 C) (Oral)   Resp 16   Ht 5' 7" (1.702 m)   Wt (!) 145.2 kg   SpO2 92%   BMI 50.12 kg/m   Physical Exam  Constitutional: Appears well-developed and well-nourished.  Psych: Affect appropriate to situation Eyes: No scleral injection HENT: No OP obstrucion Head: Normocephalic.  Cardiovascular: Normal rate and regular rhythm.  Respiratory: Effort normal and breath sounds normal to anterior ascultation GI: Soft.  No distension. There is no tenderness.  Skin: WDI  Neuro: Mental Status: Patient is awake, alert, oriented to person, place, month, year, and situation. Patient is able to give a clear and coherent history. No signs of aphasia or neglect. Pupils are round dilated sluggishly reactive due to mydriatic. VA unable d/t mydriatic. Mild pallor right fundus. c-d-r <0.3 inde. Left eye Right eye  Visual field -  full Visual field - Left nasal hemianopsia  Ishihara 14/14 Ishihara 0/14  EOMI without ptosis or diploplia.  Facial sensation is symmetric to temperature Facial movement is symmetric.  Hearing is intact to voice Uvula elevates symmetrically Shoulder shrug is symmetric. Tongue is midline without atrophy or fasciculations.  Motor: Tone is normal. Bulk is normal. 5/5 strength was present in all four extremities.  Sensory: Sensation is symmetric to light touch and temperature in the arms and legs.  Deep Tendon Reflexes: 2+ and symmetric in the biceps and patellae.  Plantars: Toes are downgoing bilaterally.  Cerebellar: FNF and HKS are intact bilaterally.    I have reviewed labs in epic and   the pertinent results are: Results for Pann, Kathlene A (MRN [id number]) as of 10/14/2020 02:35  Ref. Range 08/12/2012 11:14  Total CHOL/HDL Ratio Unknown 4  Cholesterol Latest Ref Range: 0 - 200 mg/dL 177  HDL Cholesterol Latest Ref Range: >39.00 mg/dL 41.90  LDL (calc) Latest Ref Range: 0 - 99 mg/dL 118 (H)  Triglycerides Latest  Ref Range: 0 - 149 mg/dL 86.0  VLDL Latest Ref Range: 0.0 - 40.0 mg/dL 17.2   ESR 17, CRP 2.0 H but non-specific I have reviewed the images obtained: MRI brain showed no acute ischemic, hemorrhagic or mass lesions.  Assessment: Sandy Reed is a 59 y.o. female vascular risk factors with acutely progressing right monocular hemianopsia. Reviewed automated static perimetry results with good reliability and minimal fixation loss which indeed showed right nasal monocular hemianopsia concordant with neuro exam. EOMs without pain. Ishihara testing suggests retinal lesion.   During interview she mentioned ~45 years ago an eye doctor noted optic nerve swelling and she was evaluated by several specialists including [facility] and nothing came of it. 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 Additionally, the visual field defect does not respect meridians making embolism much more likely etiology.  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 Hyperlipidemia HTN   Plan: - Vascular imaging such as carotid ultrasound or MRA head and neck. - Recommend TTE. - Recommend labs: HbA1c,TSH. - Recommend Statin as LDL 118. - Continue aspirin 81mg daily per home dose. - Clopidogrel 75mg daily for 3 weeks. - BP goal <140/90 (for now as serum glucose 91 with her trend in chart). - Telemetry monitoring for arrhythmia. - Patient was educated on stroke and risk factor reduction.  - PT/OT/SLP consult. - PPx: DVT LMWH/ambulation, GI consider PPI - Follow up outpatient neurology on discharge.  Electronically signed by: Dr. Signora Zucco Pager: [phone] 10/14/2020, 1:41 AM   

## 2020-10-14 NOTE — Progress Notes (Addendum)
STROKE TEAM PROGRESS NOTE   INTERVAL HISTORY  States that about two weeks ago she noticed a change in vision in her right eye where she could not see out of periphery near her nose. This has slightly progressed since that time. No eye pain, headache, temporal pain, jaw pain, pain with eating. No rash or muscle aches or weakness. No joint pain except for her chronic arthritis.   She owns a cat indoors, but does not think she has been scratched recently[place] She does not know of any recent tick bites.   She has a history of migraines that would cause weakness but has not experienced this in a while.   Vitals:   10/14/20 0600 10/14/20 0700 10/14/20 0740 10/14/20 0800  BP: (!) 147/71 (!) 148/87  (!) 161/98  Pulse: 84 87  97  Resp: (!) 24 (!) 25  (!) 22  Temp:   98.4 F (36.9 C) 98.7 F (37.1 C)  TempSrc:   Oral Oral  SpO2: 95% 94%  93%  Weight:      Height:       CBC:  Recent Labs  Lab 10/13/20 1959 10/14/20 0526  WBC 10.3 8.0  NEUTROABS 7.0  --   HGB 14.7 13.4  HCT 45.4 41.6  MCV 94.4 94.3  PLT 287 272   Basic Metabolic Panel:  Recent Labs  Lab 10/13/20 2122 10/14/20 0526  NA 136  --   K 3.6  --   CL 101  --   CO2 23  --   GLUCOSE 91  --   BUN 13  --   CREATININE 0.74 0.68  CALCIUM 9.3  --    Lipid Panel:  Recent Labs  Lab 10/14/20 0526  CHOL 160  TRIG 69  HDL 40*  CHOLHDL 4.0  VLDL 14  LDLCALC 161*   Urine Drug Screen: No results for input(s): LABOPIA, COCAINSCRNUR, LABBENZ, AMPHETMU, THCU, LABBARB in the last 168 hours.  Alcohol Level No results for input(s): ETH in the last 168 hours.  IMAGING past 24 hours MR Brain W and Wo Contrast  Result Date: 10/13/2020 CLINICAL DATA:  Headache and papilledema EXAM: MRI HEAD AND ORBITS WITHOUT AND WITH CONTRAST TECHNIQUE: Multiplanar, multiecho pulse sequences of the brain and surrounding structures were obtained without and with intravenous contrast. Multiplanar, multiecho pulse sequences  of the orbits and surrounding structures were obtained including fat saturation techniques, before and after intravenous contrast administration. CONTRAST:  69mL GADAVIST GADOBUTROL 1 MMOL/ML IV SOLN COMPARISON:  None. FINDINGS: MRI HEAD FINDINGS Brain: No acute infarct, acute hemorrhage or extra-axial collection. Multifocal hyperintense T2-weighted signal within the white matter. Normal volume of CSF spaces. No chronic microhemorrhage. Normal midline structures. There is no abnormal contrast enhancement. Vascular: Normal flow voids. Skull and upper cervical spine: Normal marrow signal. Other: None. MRI ORBITS FINDINGS Orbits: No traumatic or inflammatory finding. Globes, optic nerves, orbital fat, extraocular muscles, vascular structures, and lacrimal glands are normal. Visualized sinuses: Clear. Soft tissues: Negative. IMPRESSION: 1. Normal MRI of the orbits. 2. No acute intracranial abnormality. 3. Multifocal hyperintense T2-weighted signal within the white matter, consistent with chronic small vessel disease. Electronically Signed   By: Deatra Robinson M.D.   On: 10/13/2020 23:05   MR ORBITS W WO CONTRAST  Result Date: 10/13/2020 CLINICAL DATA:  Headache and papilledema EXAM: MRI HEAD AND ORBITS WITHOUT AND WITH CONTRAST TECHNIQUE: Multiplanar, multiecho pulse sequences of the brain and surrounding structures were obtained without and with intravenous  contrast. Multiplanar, multiecho pulse sequences of the orbits and surrounding structures were obtained including fat saturation techniques, before and after intravenous contrast administration. CONTRAST:  62mL GADAVIST GADOBUTROL 1 MMOL/ML IV SOLN COMPARISON:  None. FINDINGS: MRI HEAD FINDINGS Brain: No acute infarct, acute hemorrhage or extra-axial collection. Multifocal hyperintense T2-weighted signal within the white matter. Normal volume of CSF spaces. No chronic microhemorrhage. Normal midline structures. There is no abnormal contrast enhancement.  Vascular: Normal flow voids. Skull and upper cervical spine: Normal marrow signal. Other: None. MRI ORBITS FINDINGS Orbits: No traumatic or inflammatory finding. Globes, optic nerves, orbital fat, extraocular muscles, vascular structures, and lacrimal glands are normal. Visualized sinuses: Clear. Soft tissues: Negative. IMPRESSION: 1. Normal MRI of the orbits. 2. No acute intracranial abnormality. 3. Multifocal hyperintense T2-weighted signal within the white matter, consistent with chronic small vessel disease. Electronically Signed   By: Deatra Robinson M.D.   On: 10/13/2020 23:05    PHYSICAL EXAM Constitution: NAD, supine in bed, obese HENT: [place]/AT Respiratory: non-labored breathing, on room air Neuro: Mental Status: Patient is awake, alert, oriented to person, place, month, year, and situation. Speech-shows no dysarthria or aphasia. comprehension intact.  Patient is able to give a clear history of events. Able to follow commands Cranial Nerves: II: right eye with full medial peripheral vision loss in addition to upper outer quardrant; left eye visual field intact III,IV, VI: EOMI without ptosis or diploplia. Pupils equal, round and reactive to light VII: Facial movement is symmetric.  VIII: hearing is intact to voice X: Palate elevates symmetrically XI: Shoulder shrug is symmetric. XII: tongue is midline without atrophy or fasciculations.  Motor: Strength is 5/5 upper and lower extremities   Cerebellar: Normal finger to nose    ASSESSMENT/PLAN Sandy Reed is a 59 y.o. female with history of hypertension, hemiplegic migraine presenting with blurring of vision in the right eye for the last 10 days, .   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 Will initiate work-up for arteritic source of possible inflammation. She has no symptoms of GCA although CRP is elevated.   MRI of the orbits without acute findings.  MRA head and neck with narrowing and tortuosity of the left vertebral artery which terminates as PICA. Per neurologist review, CCA and right vertebral artery narrowing are more consistent with motion artifact.   Will place orders for vasculitis work-up in addition to other possible sources of vision loss: ANA, ANCA, C3/C4, bartonella henselae ab, Lyme PCR  Stop plavix, continue aspirin 81 mg  2D Echo pending  LDL 106 - can treat to LDL goal <130, no increased risk factor of stroke  HgbA1c pending  VTE prophylaxis - lovenox  PT/OT pending    Diet   Diet Heart Room service appropriate? Yes; Fluid consistency: Thin     Disposition: Continue aspirin 81 mg. Follow-up with ophthalmology and PCP at discharge for further work-up. Neurology will sign off. Please call if further questions or evaluation needed.   Hypertension  Home meds: benazepril-hydrochlorthiazide 12.5 mg qd, verapamil 240 mg cr  Stable  Hyperlipidemia  LDL 106, goal < 130  Can discontinue statin  Other Stroke Risk Factors  Obesity, Body mass index is 50.12 kg/m., BMI >/= 30 associated with increased stroke risk,  recommend weight loss, diet and exercise as appropriate   Family hx stroke (father)  Migraines  Hospital day # 0 I have personally obtained history,examined this patient, reviewed notes, independently viewed imaging studies, participated in medical decision making and plan of care.ROS completed by me personally and pertinent positives fully documented  I have made any additions or clarifications directly to the above note. Agree with note above. 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  Brain and orbital imaging as well as neurovascular imaging is unremarkable.  Elevated C-reactive protein may be nonspecific but will check peripheral blood markers for vasculitis as well as antibodies for cat scratch disease and Lyme's.  Recommend aspirin 81 mg alone and patient may be discharged home.  Have outpatient follow-up with ophthalmologist.  Long discussion with patient and family member at the bedside and answered questions.  Greater than 50% time during this 35-minute visit was spent in counseling and coordination of care about her vision loss and answering questions.  Discussed with Dr. Sela Hua, MD  Delia Heady, MD Medical Director [facility] Pager: [phone] 10/14/2020 2:42 PM   To contact Stroke Continuity provider, please refer to [email or web address]. After hours, contact General Neurology

## 2020-10-14 NOTE — Consult Note (Signed)
Consult H&P  CC: monocular visual loss.  History is obtained from: patient and outside medical records  HPI: Sandy Reed is a 59 y.o. female referred to ED by ophthalmologist for MRI due to 10 days of slowly progressing viusousl loss in her right eye. Described as if she was "looking through a [place] grey-translucent] piece of lace" through the central filed of her right eye with the adjacent nasal field "like everything is cut off". It started off as black spots floating which gradually expanded.  CT head [place] yesterday (-).  Opthothalmology visit today showed optic nerve edema.  Past medical history significant for HTN, migraines, obesity. 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  Reviewed and confirmed nursing documentation for past medical history, family history, social history.  Denies pain with eye movement.  Denies headache, dizziness, slurred speech, focal weakness, N/V, intercurrent illness.  LKW: 10 days ago tpa given?: No,  IR Thrombectomy? No,  Modified Rankin Scale: 0-Completely asymptomatic and back to baseline post- stroke NIHSS: 1  ROS: A complete ROS was performed and is negative except as noted in the HPI.    Past Medical History:  Diagnosis Date  . Hypertension   . Obesity    Significant maternal history of cardiac murmurs  Social History:  reports that she has never smoked. She does not have any smokeless tobacco history on file. She reports that she does not drink alcohol and does not use drugs.   Prior to Admission medications   Medication Sig Start Date End Date Taking? Authorizing Provider  benazepril-hydrochlorthiazide (LOTENSIN HCT) 10-12.5 MG tablet Take 1 tablet by mouth daily. 08/26/20  Yes [provider]  verapamil (CALAN-SR) 240 MG CR tablet TAKE ONE TABLET BY MOUTH ONCE DAILY    Ricard Dillon, MD   Exam: Current vital  signs: BP 133/85   Pulse 78   Temp 98 F (36.7 C) (Oral)   Resp 16   Ht 5' 7" (1.702 m)   Wt (!) 145.2 kg   SpO2 92%   BMI 50.12 kg/m   Physical Exam  Constitutional: Appears well-developed and well-nourished.  Psych: Affect appropriate to situation Eyes: No scleral injection HENT: No OP obstrucion Head: Normocephalic.  Cardiovascular: Normal rate and regular rhythm.  Respiratory: Effort normal and breath sounds normal to anterior ascultation GI: Soft.  No distension. There is no tenderness.  Skin: WDI  Neuro: Mental Status: Patient is awake, alert, oriented to person, place, month, year, and situation. Patient is able to give a clear and coherent history. No signs of aphasia or neglect. Pupils are round dilated sluggishly reactive due to mydriatic. VA unable d/t mydriatic. Mild pallor right fundus. c-d-r <0.3 inde. Left eye Right eye  Visual field -  full Visual field - Left nasal hemianopsia  Ishihara 14/14 Ishihara 0/14  EOMI without ptosis or diploplia.  Facial sensation is symmetric to temperature Facial movement is symmetric.  Hearing is intact to voice Uvula elevates symmetrically Shoulder shrug is symmetric. Tongue is midline without atrophy or fasciculations.  Motor: Tone is normal. Bulk is normal. 5/5 strength was present in all four extremities.  Sensory: Sensation is symmetric to light touch and temperature in the arms and legs.  Deep Tendon Reflexes: 2+ and symmetric in the biceps and patellae.  Plantars: Toes are downgoing bilaterally.  Cerebellar: FNF and HKS are intact bilaterally.    I have reviewed labs in epic and  the pertinent results are: Results for Megill, Iretha A (MRN [id number]) as of 10/14/2020 02:35  Ref. Range 08/12/2012 11:14  Total CHOL/HDL Ratio Unknown 4  Cholesterol Latest Ref Range: 0 - 200 mg/dL 177  HDL Cholesterol Latest Ref Range: >39.00 mg/dL 41.90  LDL (calc) Latest Ref Range: 0 - 99 mg/dL 118 (H)  Triglycerides Latest  Ref Range: 0 - 149 mg/dL 86.0  VLDL Latest Ref Range: 0.0 - 40.0 mg/dL 17.2   ESR 17, CRP 2.0 H but non-specific I have reviewed the images obtained: MRI brain showed no acute ischemic, hemorrhagic or mass lesions.  Assessment: Sandy Reed is a 59 y.o. female vascular risk factors with acutely progressing right monocular hemianopsia. Reviewed automated static perimetry results with good reliability and minimal fixation loss which indeed showed right nasal monocular hemianopsia concordant with neuro exam. EOMs without pain. Ishihara testing suggests retinal lesion.   During interview she mentioned ~45 years ago an eye doctor noted optic nerve swelling and she was evaluated by several specialists including [facility] and nothing came of it. 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 Additionally, the visual field defect does not respect meridians making embolism much more likely etiology.  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 Hyperlipidemia HTN   Plan: - Vascular imaging such as carotid ultrasound or MRA head and neck. - Recommend TTE. - Recommend labs: HbA1c,TSH. - Recommend Statin as LDL 118. - Continue aspirin 81mg daily per home dose. - Clopidogrel 75mg daily for 3 weeks. - BP goal <140/90 (for now as serum glucose 91 with her trend in chart). - Telemetry monitoring for arrhythmia. - Patient was educated on stroke and risk factor reduction.  - PT/OT/SLP consult. - PPx: DVT LMWH/ambulation, GI consider PPI - Follow up outpatient neurology on discharge.  Electronically signed by: Dr. Hunter Collins Pager: [phone] 10/14/2020, 1:41 AM   

## 2020-10-14 NOTE — ED Provider Notes (Signed)
Care assumed from Dr. Nunzio Cory, patient with monocular vision loss.  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  Will need to be admitted for remainder of stroke work-up.  Case is discussed with Dr. Toniann Fail of [facility], who agrees to admit the patient.   Dione Booze, MD 10/14/20 [phone]

## 2020-10-14 NOTE — Progress Notes (Signed)
PT Cancellation Note  Patient Details Name: Sandy Reed MRN: [id number] DOB: [birth year]   Cancelled Treatment:    Reason Eval/Treat Not Completed: Patient at procedure or test/unavailable Pt currently at MRI. Will follow up as schedule allows.   Farley Ly, PT, DPT  Acute Rehabilitation Services  Pager: [phone] Office: [phone]    Lehman Prom 10/14/2020, 9:08 AM

## 2020-10-14 NOTE — ED Notes (Signed)
covid lab swab in process

## 2020-10-14 NOTE — Progress Notes (Signed)
OT Cancellation Note  Patient Details Name: Sandy Reed MRN: [id number] DOB: Jan 05, 1961   Cancelled Treatment:    Reason Eval/Treat Not Completed: (P) Patient at procedure or test/ unavailable  [facility] 10/14/2020, 9:16 AM  Luisa Dago, OT/L   Acute OT Clinical Specialist Acute Rehabilitation Services Pager [phone] Office [phone]

## 2020-10-14 NOTE — Progress Notes (Signed)
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 Pt completing ADL tasks at baseline. Educated pt on compensatory strategies for low vision using adequate lighting, decreasing clutter and increasing contrast. Educated on scanning techniques to increase safety. Discussed driving - pt plans to limit her night time driving. Pt states she is doing better with reading and using her computer. Discussed follow up recommendations - if pt has frustration with vision after DC which interferes with work, recommend she contact her doctor and schedule follow up with outpt OT. No further acute OT needs.    10/14/20 1300  OT Visit Information  Assistance Needed +1  History of Present Illness Pt is a 59 y/o female admitted secondary to R vision loss. Found to have central retinal artery occlusion. PMH includes HTN.   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; works as Educational psychologist No difficulties  Pain Assessment  Pain Assessment No/denies pain  Cognition  Arousal/Alertness Awake/alert  Behavior During Therapy WFL for tasks assessed/performed  Overall Cognitive Status Within Functional Limits for tasks assessed  Upper Extremity Assessment  Upper Extremity Assessment Overall WFL for tasks  assessed  Lower Extremity Assessment  Lower Extremity Assessment Overall WFL for tasks assessed  Cervical / Trunk Assessment  Cervical / Trunk Assessment Normal  ADL  Overall ADL's  At baseline  Vision- History  Baseline Vision/History Wears glasses  Wears Glasses Reading only  Patient Visual Report Central vision impairment (nasal field cut on R)  Vision- Assessment  Vision Assessment? Yes  Eye Alignment WFL  Ocular Range of Motion [facility]  Alignment/Gaze Preference WDL  Tracking/Visual Pursuits Able to track stimulus in all quads without difficulty  Saccades WFL  Convergence WFL  Visual Fields Right visual field deficit (R eye)  Bed Mobility  Overal bed mobility Independent  Transfers  Overall transfer level Independent  Balance  Overall balance assessment Independent  OT - End of Session  Activity Tolerance Patient tolerated treatment well  Patient left in bed;with call bell/phone within reach  Nurse Communication Other (comment) (no follow up needed)  OT Assessment  OT Recommendation/Assessment Patient does not need any further OT services  OT Visit Diagnosis Low vision, 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 4  Help from another person taking care of personal grooming? 4  Help from another person toileting, which includes using toliet, bedpan, or urinal? 4  Help from another person bathing (including washing, rinsing, drying)? 4  Help from another person to put on and taking off regular upper body clothing? 4  Help from another person to put on and taking off regular lower body clothing? 4  6 Click Score 24  OT Recommendation  Follow Up Recommendations No OT follow up  OT Equipment None recommended by OT  Acute Rehab OT Goals  Patient Stated Goal to go home  OT Goal Formulation All assessment and education complete, DC therapy  OT  Time Calculation  OT Start Time (ACUTE ONLY) 1215  OT Stop Time (ACUTE ONLY) 1230  OT Time Calculation (min) 15 min  OT General Charges  $OT Visit 1 Visit  OT Evaluation  $OT Eval Low Complexity 1 Low  Written Expression  Dominant Hand Right  Luisa Dago, OT/L   Acute OT Clinical Specialist Acute Rehabilitation Services Pager [phone] Office [phone]

## 2020-10-14 NOTE — H&P (Signed)
History and Physical    Sandy Reed GBE:[id number] DOB: 1961/08/17 DOA: 10/13/2020  PCP: Stacie Glaze, MD  Patient coming from: Home.  Chief Complaint: Decreased vision on the right eye.  HPI: Sandy Reed is a 59 y.o. female with history of hypertension, migraine has been experiencing blurriness of the vision of the right eye for the last 10 days.  It is more on the medial aspect of the right eye vision.  Denies any weakness of the upper or lower extremities.  Last couple of days patient has also had some mild headache on the right side.  No pain no tearing of the eyes.  Had gone to the ophthalmologist and was referred to the ER for further work-up including MRI brain.  ED Course: In the ER MRI of the brain and orbits did not show any acute.  On-call neurologist was consulted.  At this time patient is being admitted for further work-up given the features concerning for central retinal artery occlusion.  Patient passed swallow.  CRP was 2 metabolic panel CBC unremarkable Covid test is pending.  EKG is pending.  Monitor shows sinus rhythm.  Review of Systems: As per HPI, rest all negative.   Past Medical History:  Diagnosis Date  . Hypertension   . Obesity     History reviewed. No pertinent surgical history.   reports that she has never smoked. She has never used smokeless tobacco. She reports that she does not drink alcohol and does not use drugs.  No Known Allergies  Family History  Problem Relation Age of Onset  . Stroke Father     Prior to Admission medications   Medication Sig Start Date End Date Taking? Authorizing Provider  benazepril-hydrochlorthiazide (LOTENSIN HCT) 10-12.5 MG tablet Take 1 tablet by mouth daily. 08/26/20  Yes [provider]  verapamil (CALAN-SR) 240 MG CR tablet TAKE ONE TABLET BY MOUTH ONCE DAILY    Stacie Glaze, MD    Physical Exam: Constitutional: Moderately built and nourished. Vitals:   10/13/20 2345 10/14/20 0045  10/14/20 0200 10/14/20 0345  BP: 139/73 133/85 124/68 132/70  Pulse: 87 78 76 77  Resp: 16   (!) 21  Temp:      TempSrc:      SpO2: 100% 92% 98% 94%  Weight:      Height:       Eyes: Anicteric no pallor. ENMT: No discharge from the ears eyes nose or mouth. Neck: No mass felt.  No neck rigidity. Respiratory: No rhonchi or crepitations. Cardiovascular: S1-S2 heard. Abdomen: Soft nontender bowel sounds present. Musculoskeletal: No edema. Skin: No rash. Neurologic: Alert awake oriented to time place and person.  Moves all extremities 5 x 5.  No facial asymmetry tongue is midline.  Pupils are equal and reacting to light. Psychiatric: Appears normal.  Normal affect.   Labs on Admission: I have personally reviewed following labs and imaging studies  CBC: Recent Labs  Lab 10/13/20 1959  WBC 10.3  NEUTROABS 7.0  HGB 14.7  HCT 45.4  MCV 94.4  PLT 287   Basic Metabolic Panel: Recent Labs  Lab 10/13/20 2122  NA 136  K 3.6  CL 101  CO2 23  GLUCOSE 91  BUN 13  CREATININE 0.74  CALCIUM 9.3   GFR: Estimated Creatinine Clearance: 113.6 mL/min (by C-G formula based on SCr of 0.74 mg/dL). Liver Function Tests: No results for input(s): AST, ALT, ALKPHOS, BILITOT, PROT, ALBUMIN in the last 168 hours. No results  for input(s): LIPASE, AMYLASE in the last 168 hours. No results for input(s): AMMONIA in the last 168 hours. Coagulation Profile: No results for input(s): INR, PROTIME in the last 168 hours. Cardiac Enzymes: No results for input(s): CKTOTAL, CKMB, CKMBINDEX, TROPONINI in the last 168 hours. BNP (last 3 results) No results for input(s): PROBNP in the last 8760 hours. HbA1C: No results for input(s): HGBA1C in the last 72 hours. CBG: No results for input(s): GLUCAP in the last 168 hours. Lipid Profile: No results for input(s): CHOL, HDL, LDLCALC, TRIG, CHOLHDL, LDLDIRECT in the last 72 hours. Thyroid Function Tests: No results for input(s): TSH, T4TOTAL, FREET4,  T3FREE, THYROIDAB in the last 72 hours. Anemia Panel: No results for input(s): VITAMINB12, FOLATE, FERRITIN, TIBC, IRON, RETICCTPCT in the last 72 hours. Urine analysis: No results found for: COLORURINE, APPEARANCEUR, LABSPEC, PHURINE, GLUCOSEU, HGBUR, BILIRUBINUR, KETONESUR, PROTEINUR, UROBILINOGEN, NITRITE, LEUKOCYTESUR Sepsis Labs: @LABRCNTIP (procalcitonin:4,lacticidven:4) )No results found for this or any previous visit (from the past 240 hour(s)).   Radiological Exams on Admission: MR Brain W and Wo Contrast  Result Date: 10/13/2020 CLINICAL DATA:  Headache and papilledema EXAM: MRI HEAD AND ORBITS WITHOUT AND WITH CONTRAST TECHNIQUE: Multiplanar, multiecho pulse sequences of the brain and surrounding structures were obtained without and with intravenous contrast. Multiplanar, multiecho pulse sequences of the orbits and surrounding structures were obtained including fat saturation techniques, before and after intravenous contrast administration. CONTRAST:  2mL GADAVIST GADOBUTROL 1 MMOL/ML IV SOLN COMPARISON:  None. FINDINGS: MRI HEAD FINDINGS Brain: No acute infarct, acute hemorrhage or extra-axial collection. Multifocal hyperintense T2-weighted signal within the white matter. Normal volume of CSF spaces. No chronic microhemorrhage. Normal midline structures. There is no abnormal contrast enhancement. Vascular: Normal flow voids. Skull and upper cervical spine: Normal marrow signal. Other: None. MRI ORBITS FINDINGS Orbits: No traumatic or inflammatory finding. Globes, optic nerves, orbital fat, extraocular muscles, vascular structures, and lacrimal glands are normal. Visualized sinuses: Clear. Soft tissues: Negative. IMPRESSION: 1. Normal MRI of the orbits. 2. No acute intracranial abnormality. 3. Multifocal hyperintense T2-weighted signal within the white matter, consistent with chronic small vessel disease. Electronically Signed   By: 9m M.D.   On: 10/13/2020 23:05   MR ORBITS W WO  CONTRAST  Result Date: 10/13/2020 CLINICAL DATA:  Headache and papilledema EXAM: MRI HEAD AND ORBITS WITHOUT AND WITH CONTRAST TECHNIQUE: Multiplanar, multiecho pulse sequences of the brain and surrounding structures were obtained without and with intravenous contrast. Multiplanar, multiecho pulse sequences of the orbits and surrounding structures were obtained including fat saturation techniques, before and after intravenous contrast administration. CONTRAST:  32mL GADAVIST GADOBUTROL 1 MMOL/ML IV SOLN COMPARISON:  None. FINDINGS: MRI HEAD FINDINGS Brain: No acute infarct, acute hemorrhage or extra-axial collection. Multifocal hyperintense T2-weighted signal within the white matter. Normal volume of CSF spaces. No chronic microhemorrhage. Normal midline structures. There is no abnormal contrast enhancement. Vascular: Normal flow voids. Skull and upper cervical spine: Normal marrow signal. Other: None. MRI ORBITS FINDINGS Orbits: No traumatic or inflammatory finding. Globes, optic nerves, orbital fat, extraocular muscles, vascular structures, and lacrimal glands are normal. Visualized sinuses: Clear. Soft tissues: Negative. IMPRESSION: 1. Normal MRI of the orbits. 2. No acute intracranial abnormality. 3. Multifocal hyperintense T2-weighted signal within the white matter, consistent with chronic small vessel disease. Electronically Signed   By: 9m M.D.   On: 10/13/2020 23:05      Assessment/Plan Principal Problem:   Central retinal artery occlusion, right Active Problems:   Essential hypertension   Retinal  artery branch occlusion, right    1. Central retinal artery occlusion of the right side -appreciate neurology consult discussed with neurologist Dr. Alfonzo Feller at this time plan is to get MRI of the head and MRA of the neck.  Patient passed swallow evaluation.  Check 2D echo.  Monitor in telemetry EKG is pending.  On statins aspirin Plavix.  Check hemoglobin A1c lipid  panel. 2. Hypertension on benazepril hydrochlorothiazide as per neurology recommendation needs to have aggressive control with systolic blood pressure less than 140.  Follow blood pressure trends closely. 3. History of migraines.   DVT prophylaxis: Lovenox. Code Status: Full code. Family Communication: Family at the bedside. Disposition Plan: Home. Consults called: Neurology. Admission status: Observation.   Eduard Clos MD [facility] Pager [phone].  If 7PM-7AM, please contact night-coverage [email or web address] Password [email or web address]  10/14/2020, 5:30 AM

## 2020-10-14 NOTE — ED Notes (Signed)
Pt requesting to leave. Dr. Toniann Fail notified of pt request. Advised patient will have to sign out AMA. Risks and Benefits explained to patient. Pt states that she will stay at this time.

## 2020-10-14 NOTE — ED Notes (Signed)
Pt placed on a hospital bed

## 2020-10-14 NOTE — Progress Notes (Signed)
  Echocardiogram 2D Echocardiogram has been performed.  Sandy Reed G Sandy Reed 10/14/2020, 11:43 AM

## 2020-10-14 NOTE — Discharge Summary (Signed)
Physician Discharge Summary  Sandy Reed BPZ:[id number] DOB: September 03, 1961 DOA: 10/13/2020  PCP: Stacie Glaze, MD  Admit date: 10/13/2020 Discharge date: 10/14/2020  Admitted From: Home Discharge disposition: Home   Code Status: Full Code  Diet Recommendation: Cardiac diet  Discharge Diagnosis:   Principal Problem:   Abnormal peripheral vision of right eye Active Problems:   Essential hypertension   History of Present Illness / Brief narrative:  Sandy Reed is a 59 y.o. female with PMH significant for morbid obesity, hypertension, migraine. She presented to the ED on 10/28 with 10 days history slowly progressing right eye visual loss.  She saw an ophthalmologist in the office, noted to have optic nerve edema on examination and was referred to ED for MRI.  Patient described the symptoms as if she was "looking through a [dark grey-translucent] piece of lace" through the central filed of her right eye with the adjacent nasal field "like everything is cut off". It started off as black spots floating which gradually expanded. Prior to admission he had a CT scan of head done in hospital which was negative.  In the ED, MRI brain was obtained which did not show any acute finding.  On-call neurologist was consulted.  Patient was admitted for further work-up .   Subjective:  Seen and examined this morning.  Not in distress.  Continues to have same symptoms. Seen by neurology.  Hospital Course:  Right Eye Medial Peripheral Visual Loss  -Neurology consult appreciated. 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   MRI of the orbits without acute findings.  MRA head and neck with narrowing and tortuosity of the  left vertebral artery which terminates as PICA. Per neurologist review, CCA and right vertebral artery narrowing are more consistent with motion artifact.  She has no symptoms of GCA although CRP is elevated.  Neurologist has ordered for vasculitis work-up in addition to other possible sources of vision loss: ANA, ANCA, C3/C4, bartonella henselae ab, Lyme PCR  Continue aspirin 81 mg  2D Echopending  LDL106 - can treat to LDL goal <130, no increased risk factor of stroke  We'll discharge the patient on aspirin 81 mg daily.  Patient will continue to follow-up with ophthalmology and neurology as an outpatient.  Hypertension -Home meds include verapamil, benazepril and hydrochlorothiazide. -Continue the same to target blood pressure less than 140/90.  History of migraines  Stable for discharge to home today.  Wound care:    Discharge Exam:   Vitals:   10/14/20 0700 10/14/20 0740 10/14/20 0800 10/14/20 1141  BP: (!) 148/87  (!) 161/98 140/73  Pulse: 87  97 91  Resp: (!) 25  (!) 22 20  Temp:  98.4 F (36.9 C) 98.7 F (37.1 C) 98.8 F (37.1 C)  TempSrc:  Oral Oral Oral  SpO2: 94%  93% 97%  Weight:      Height:        Body mass index is 50.12 kg/m.  General exam: Appears calm and comfortable.  Not in physical distress Skin: No rashes, lesions or ulcers. HEENT: Atraumatic, normocephalic, supple neck, no obvious bleeding Lungs: Clear to auscultation bilaterally CVS: Regular rate and rhythm, no murmur GI/Abd soft, nontender, nondistended, bowel sound present CNS: Alert, awake amount of x3 Psychiatry: Mood appropriate Extremities:  No pedal edema, no calf tenderness  Follow ups:   Discharge Instructions    Diet - low sodium heart healthy   Complete by: As directed    Increase activity slowly   Complete by: As directed       Follow-up Information    Stacie GlazeJenkins, John E, MD. Schedule an appointment as soon as possible for a visit in 1 week.   Specialty: Internal  Medicine Contact information: [address] [place] [id number] [phone]        [facility].   Contact information: [address]  [place] [phone]              Recommendations for Outpatient Follow-Up:   1. Follow-up with ophthalmology as an outpatient 2. Follow-up with neurology as an outpatient  Discharge Instructions:  Follow with Primary MD Stacie GlazeJenkins, John E, MD in 7 days   Get CBC/BMP checked in next visit within 1 week by PCP or SNF MD ( we routinely change or add medications that can affect your baseline labs and fluid status, therefore we recommend that you get the mentioned basic workup next visit with your PCP, your PCP Sandy decide not to get them or add new tests based on their clinical decision)  On your next visit with your PCP, please Get Medicines reviewed and adjusted.  Please request your PCP  to go over all Hospital Tests and Procedure/Radiological results at the follow up, please get all Hospital records sent to your Prim MD by signing hospital release before you go home.  Activity: As tolerated with Full fall precautions use walker/cane & assistance as needed  For Heart failure patients - Check your Weight same time everyday, if you gain over 2 pounds, or you develop in leg swelling, experience more shortness of breath or chest pain, call your Primary MD immediately. Follow Cardiac Low Salt Diet and 1.5 lit/day fluid restriction.  If you have smoked or chewed Tobacco in the last 2 yrs please stop smoking, stop any regular Alcohol  and or any Recreational drug use.  If you experience worsening of your admission symptoms, develop shortness of breath, life threatening emergency, suicidal or homicidal thoughts you must seek medical attention immediately by calling 911 or calling your MD immediately  if symptoms less severe.  You Must read complete instructions/literature along with  all the possible adverse reactions/side effects for all the Medicines you take and that have been prescribed to you. Take any new Medicines after you have completely understood and accpet all the possible adverse reactions/side effects.   Do not drive, operate heavy machinery, perform activities at heights, swimming or participation in water activities or provide baby sitting services if your were admitted for syncope or siezures until you have seen by Primary MD or a Neurologist and advised to do so again.  Do not drive when taking Pain medications.  Do not take more than prescribed Pain, Sleep and Anxiety Medications  Wear Seat belts while driving.   Please note You were cared for by a hospitalist during your hospital stay. If you have any questions about your discharge medications or the care you received while you were in the hospital after you are discharged, you can call the unit and asked to speak with the hospitalist on call if the hospitalist that took care of you is not available. Once you are discharged, your primary care physician will handle any further medical issues. Please note that NO  REFILLS for any discharge medications will be authorized once you are discharged, as it is imperative that you return to your primary care physician (or establish a relationship with a primary care physician if you do not have one) for your aftercare needs so that they can reassess your need for medications and monitor your lab values.    Allergies as of 10/14/2020   No Known Allergies     Medication List    TAKE these medications   aspirin 81 MG EC tablet Take 1 tablet (81 mg total) by mouth daily. Swallow whole. Start taking on: October 15, 2020   benazepril-hydrochlorthiazide 10-12.5 MG tablet Commonly known as: LOTENSIN HCT Take 1 tablet by mouth daily.   verapamil 240 MG CR tablet Commonly known as: CALAN-SR TAKE ONE TABLET BY MOUTH ONCE DAILY       Time coordinating discharge:  35 minutes  The results of significant diagnostics from this hospitalization (including imaging, microbiology, ancillary and laboratory) are listed below for reference.    Procedures and Diagnostic Studies:   MR ANGIO HEAD WO CONTRAST  Result Date: 10/14/2020 CLINICAL DATA:  Stroke follow-up.  Brain MRI from yesterday EXAM: MRA HEAD WITHOUT CONTRAST TECHNIQUE: Angiographic images of the Circle of Willis were obtained using MRA technique without intravenous contrast. COMPARISON:  None. FINDINGS: The carotid arteries are smooth and widely patent. Early branching right MCA. No stenosis, beading, or aneurysm. The left vertebral artery is hypoplastic and terminates in the PICA. The right vertebral and basilar arteries are smooth and widely patent. No PCA branch occlusion or stenosis. Negative for aneurysm IMPRESSION: Normal intracranial MRA. Electronically Signed   By: Marnee Spring M.D.   On: 10/14/2020 09:58   MR ANGIO NECK W WO CONTRAST  Result Date: 10/14/2020 CLINICAL DATA:  Neuro deficit, acute, stroke suspected. Additional history provided: Central retinal artery occlusion of the right side. EXAM: MRA NECK WITHOUT AND WITH CONTRAST TECHNIQUE: Multiplanar and multiecho pulse sequences of the neck were obtained without and with intravenous contrast. Angiographic images of the neck were obtained using MRA technique without and with intravenous contrast. CONTRAST:  42mL GADAVIST GADOBUTROL 1 MMOL/ML IV SOLN COMPARISON:  Concurrently performed MRA of the head 10/14/2020. MRI of the head and orbits 10/13/2020. FINDINGS: There is apparent narrowing of the proximal right CCA of up to 50%. Distal to this, the CCA and ICA are patent within the neck without stenosis. The left CCA and ICA are patent within the neck without hemodynamically significant stenosis. Mild atherosclerotic irregularity of the proximal CCA. The vertebral arteries are patent within the neck bilaterally with antegrade flow. Apparent  moderate narrowing at the origin of the dominant right vertebral artery. No appreciable stenosis within the non-dominant left vertebral artery within the neck. 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 However, this apparent narrowing Sandy be accentuated by vessel tortuosity and mild motion degradation at this site. The left CCA and ICA are patent within the neck without significant stenosis. Mild atherosclerotic irregularity of the proximal left CCA. The bilateral vertebral arteries are patent within the neck with antegrade flow. Apparent moderate narrowing at the origin of the dominant right vertebral artery. Electronically Signed   By: Jackey Loge DO   On: 10/14/2020 10:09   MR Brain W and Wo Contrast  Result Date: 10/13/2020 CLINICAL DATA:  Headache and papilledema EXAM: MRI HEAD AND ORBITS WITHOUT AND WITH CONTRAST TECHNIQUE: Multiplanar, multiecho pulse sequences of the brain and surrounding structures were obtained without  and with intravenous contrast. Multiplanar, multiecho pulse sequences of the orbits and surrounding structures were obtained including fat saturation techniques, before and after intravenous contrast administration. CONTRAST:  10mL GADAVIST GADOBUTROL 1 MMOL/ML IV SOLN COMPARISON:  None. FINDINGS: MRI HEAD FINDINGS Brain: No acute infarct, acute hemorrhage or extra-axial collection. Multifocal hyperintense T2-weighted signal within the white matter. Normal volume of CSF spaces. No chronic microhemorrhage. Normal midline structures. There is no abnormal contrast enhancement. Vascular: Normal flow voids. Skull and upper cervical spine: Normal marrow signal. Other: None. MRI ORBITS FINDINGS Orbits: No traumatic or inflammatory finding. Globes, optic nerves, orbital fat, extraocular muscles, vascular structures, and lacrimal glands are normal. Visualized sinuses: Clear. Soft tissues: Negative. IMPRESSION: 1. Normal MRI of the orbits. 2. No acute intracranial  abnormality. 3. Multifocal hyperintense T2-weighted signal within the white matter, consistent with chronic small vessel disease. Electronically Signed   By: Deatra Robinson M.D.   On: 10/13/2020 23:05   MR ORBITS W WO CONTRAST  Result Date: 10/13/2020 CLINICAL DATA:  Headache and papilledema EXAM: MRI HEAD AND ORBITS WITHOUT AND WITH CONTRAST TECHNIQUE: Multiplanar, multiecho pulse sequences of the brain and surrounding structures were obtained without and with intravenous contrast. Multiplanar, multiecho pulse sequences of the orbits and surrounding structures were obtained including fat saturation techniques, before and after intravenous contrast administration. CONTRAST:  10mL GADAVIST GADOBUTROL 1 MMOL/ML IV SOLN COMPARISON:  None. FINDINGS: MRI HEAD FINDINGS Brain: No acute infarct, acute hemorrhage or extra-axial collection. Multifocal hyperintense T2-weighted signal within the white matter. Normal volume of CSF spaces. No chronic microhemorrhage. Normal midline structures. There is no abnormal contrast enhancement. Vascular: Normal flow voids. Skull and upper cervical spine: Normal marrow signal. Other: None. MRI ORBITS FINDINGS Orbits: No traumatic or inflammatory finding. Globes, optic nerves, orbital fat, extraocular muscles, vascular structures, and lacrimal glands are normal. Visualized sinuses: Clear. Soft tissues: Negative. IMPRESSION: 1. Normal MRI of the orbits. 2. No acute intracranial abnormality. 3. Multifocal hyperintense T2-weighted signal within the white matter, consistent with chronic small vessel disease. Electronically Signed   By: Deatra Robinson M.D.   On: 10/13/2020 23:05     Labs:   Basic Metabolic Panel: Recent Labs  Lab 10/13/20 2122 10/14/20 0526 10/14/20 1148  NA 136  --  138  K 3.6  --  3.7  CL 101  --  103  CO2 23  --  23  GLUCOSE 91  --  107*  BUN 13  --  12  CREATININE 0.74 0.68 0.70  CALCIUM 9.3  --  9.2   GFR Estimated Creatinine Clearance: 113.6 mL/min  (by C-G formula based on SCr of 0.7 mg/dL). Liver Function Tests: Recent Labs  Lab 10/14/20 1148  AST 35  ALT 37  ALKPHOS 79  BILITOT 1.2  PROT 7.0  ALBUMIN 3.9   No results for input(s): LIPASE, AMYLASE in the last 168 hours. No results for input(s): AMMONIA in the last 168 hours. Coagulation profile No results for input(s): INR, PROTIME in the last 168 hours.  CBC: Recent Labs  Lab 10/13/20 1959 10/14/20 0526  WBC 10.3 8.0  NEUTROABS 7.0  --   HGB 14.7 13.4  HCT 45.4 41.6  MCV 94.4 94.3  PLT 287 272   Cardiac Enzymes: No results for input(s): CKTOTAL, CKMB, CKMBINDEX, TROPONINI in the last 168 hours. BNP: Invalid input(s): POCBNP CBG: No results for input(s): GLUCAP in the last 168 hours. D-Dimer No results for input(s): DDIMER in the last 72 hours. Hgb A1c No results for  input(s): HGBA1C in the last 72 hours. Lipid Profile Recent Labs    10/14/20 0526  CHOL 160  HDL 40*  LDLCALC 106*  TRIG 69  CHOLHDL 4.0   Thyroid function studies Recent Labs    10/14/20 1148  TSH 2.661   Anemia work up No results for input(s): VITAMINB12, FOLATE, FERRITIN, TIBC, IRON, RETICCTPCT in the last 72 hours. Microbiology Recent Results (from the past 240 hour(s))  Respiratory Panel by RT PCR (Flu A&B, Covid) - Nasopharyngeal Swab     Status: None   Collection Time: 10/14/20  4:08 AM   Specimen: Nasopharyngeal Swab  Result Value Ref Range Status   SARS Coronavirus 2 by RT PCR NEGATIVE NEGATIVE Final    Comment: (NOTE) SARS-CoV-2 target nucleic acids are NOT DETECTED.  The SARS-CoV-2 RNA is generally detectable in upper respiratoy specimens during the acute phase of infection. The lowest concentration of SARS-CoV-2 viral copies this assay can detect is 131 copies/mL. A negative result does not preclude SARS-Cov-2 infection and should not be used as the sole basis for treatment or other patient management decisions. A negative result Sandy occur with  improper specimen  collection/handling, submission of specimen other than nasopharyngeal swab, presence of viral mutation(s) within the areas targeted by this assay, and inadequate number of viral copies (<131 copies/mL). A negative result must be combined with clinical observations, patient history, and epidemiological information. The expected result is Negative.  Fact Sheet for Patients:  https://www.moore.com/  Fact Sheet for Healthcare Providers:  https://www.young.biz/  This test is no t yet approved or cleared by the [place] FDA and  has been authorized for detection and/or diagnosis of SARS-CoV-2 by FDA under an Emergency Use Authorization (EUA). This EUA will remain  in effect (meaning this test can be used) for the duration of the COVID-19 declaration under Section 564(b)(1) of the Act, 21 U.S.C. section 360bbb-3(b)(1), unless the authorization is terminated or revoked sooner.     Influenza A by PCR NEGATIVE NEGATIVE Final   Influenza B by PCR NEGATIVE NEGATIVE Final    Comment: (NOTE) The Xpert Xpress SARS-CoV-2/FLU/RSV assay is intended as an aid in  the diagnosis of influenza from Nasopharyngeal swab specimens and  should not be used as a sole basis for treatment. Nasal washings and  aspirates are unacceptable for Xpert Xpress SARS-CoV-2/FLU/RSV  testing.  Fact Sheet for Patients: https://www.moore.com/  Fact Sheet for Healthcare Providers: https://www.young.biz/  This test is not yet approved or cleared by the [place] FDA and  has been authorized for detection and/or diagnosis of SARS-CoV-2 by  FDA under an Emergency Use Authorization (EUA). This EUA will remain  in effect (meaning this test can be used) for the duration of the  Covid-19 declaration under Section 564(b)(1) of the Act, 21  U.S.C. section 360bbb-3(b)(1), unless the authorization is  terminated or revoked. Performed at [facility], [address], [place], [place] [address]      Signed: Lorin Glass  [facility] 10/14/2020, 2:36 PM

## 2020-10-14 NOTE — Evaluation (Signed)
Physical Therapy Evaluation and Discharge Patient Details Name: Sandy Reed MRN: [id number] DOB: 05/08/1961 Today's Date: 10/14/2020   History of Present Illness  Pt is a 59 y/o female admitted secondary to R vision loss. Found to have central retinal artery occlusion. PMH includes HTN.   Clinical Impression  Patient evaluated by Physical Therapy with no further acute PT needs identified. All education has been completed and the patient has no further questions. Pt overall steady with DGI tasks. No LOB noted. Discussed modification for stair navigation given visual deficits and practiced visual scanning. Pt overall at baseline for mobility, so no further acute PT needs. See below for any follow-up Physical Therapy or equipment needs. PT is signing off. Thank you for this referral. If needs change, please re-consult.      Follow Up Recommendations No PT follow up    Equipment Recommendations  None recommended by PT    Recommendations for Other Services       Precautions / Restrictions Precautions Precautions: None Restrictions Weight Bearing Restrictions: No      Mobility  Bed Mobility Overal bed mobility: Independent                  Transfers Overall transfer level: Independent                  Ambulation/Gait Ambulation/Gait assistance: Supervision Gait Distance (Feet): 200 Feet Assistive device: None Gait Pattern/deviations: Step-through pattern;Decreased stride length Gait velocity: Decreased   General Gait Details: Guarded gait, but overall steady. Discussed visual scanning to address visual deficits. Was able to perform DGI tasks without LOB.   Stairs            Wheelchair Mobility    Modified Rankin (Stroke Patients Only) Modified Rankin (Stroke Patients Only) Pre-Morbid Rankin Score: No symptoms Modified Rankin: Moderately severe disability     Balance Overall balance assessment: Modified Independent                                Standardized Balance Assessment Standardized Balance Assessment : Dynamic Gait Index   Dynamic Gait Index Level Surface: Mild Impairment Change in Gait Speed: Normal Gait with Horizontal Head Turns: Normal Gait with Vertical Head Turns: Normal Gait and Pivot Turn: Mild Impairment Step Over Obstacle: Normal Step Around Obstacles: Normal Steps: Mild Impairment Total Score: 21       Pertinent Vitals/Pain Pain Assessment: No/denies pain    Home Living Family/patient expects to be discharged to:: Private residence Living Arrangements: Parent Available Help at Discharge: Family Type of Home: House Home Access: Stairs to enter Entrance Stairs-Rails: Left Entrance Stairs-Number of Steps: 3 Home Layout: One level Home Equipment: Environmental consultant - 2 wheels;Bedside commode;Wheelchair - manual;Cane - single point Additional Comments: Daughter cares for father, but states father is pretty self sufficient    Prior Function Level of Independence: Independent               Hand Dominance        Extremity/Trunk Assessment   Upper Extremity Assessment Upper Extremity Assessment: Defer to OT evaluation    Lower Extremity Assessment Lower Extremity Assessment: Overall WFL for tasks assessed    Cervical / Trunk Assessment Cervical / Trunk Assessment: Normal  Communication   Communication: No difficulties  Cognition Arousal/Alertness: Awake/alert Behavior During Therapy: WFL for tasks assessed/performed Overall Cognitive Status: Within Functional Limits for tasks assessed  General Comments General comments (skin integrity, edema, etc.): Discussed safety with stair navigation given new visual deficits     Exercises     Assessment/Plan    PT Assessment Patent does not need any further PT services  PT Problem List         PT Treatment Interventions      PT Goals (Current goals can be found in the  Care Plan section)  Acute Rehab PT Goals Patient Stated Goal: to go home PT Goal Formulation: With patient Time For Goal Achievement: 10/14/20 Potential to Achieve Goals: Good    Frequency     Barriers to discharge        Co-evaluation               AM-PAC PT "6 Clicks" Mobility  Outcome Measure Help needed turning from your back to your side while in a flat bed without using bedrails?: None Help needed moving from lying on your back to sitting on the side of a flat bed without using bedrails?: None Help needed moving to and from a bed to a chair (including a wheelchair)?: None Help needed standing up from a chair using your arms (e.g., wheelchair or bedside chair)?: None Help needed to walk in hospital room?: None Help needed climbing 3-5 steps with a railing? : A Little 6 Click Score: 23    End of Session   Activity Tolerance: Patient tolerated treatment well Patient left: in bed;with call bell/phone within reach Nurse Communication: Mobility status PT Visit Diagnosis: Other symptoms and signs involving the nervous system (R29.898)    Time: 0102-7253 PT Time Calculation (min) (ACUTE ONLY): 15 min   Charges:   PT Evaluation $PT Eval Low Complexity: 1 Low          Cindee Salt, DPT  Acute Rehabilitation Services  Pager: [phone] Office: [phone]   [facility] Prom 10/14/2020, 1:15 PM

## 2020-10-15 LAB — C3 COMPLEMENT: C3 Complement: 205 mg/dL — ABNORMAL HIGH (ref 82–167)

## 2020-10-15 LAB — C4 COMPLEMENT: Complement C4, Body Fluid: 31 mg/dL (ref 12–38)

## 2020-10-16 LAB — MPO/PR-3 (ANCA) ANTIBODIES
ANCA Proteinase 3: <3.5 U/mL (ref 0.0–3.5)
Myeloperoxidase Abs: <9 U/mL (ref 0.0–9.0)

## 2020-10-16 LAB — ANA W/REFLEX IF POSITIVE: Anti Nuclear Antibody (ANA): NEGATIVE

## 2020-10-17 LAB — BARTONELLA ANTIBODY PANEL
B Quintana IgM: NEGATIVE titer
B henselae IgG: NEGATIVE titer
B henselae IgM: NEGATIVE titer
B quintana IgG: NEGATIVE titer

## 2021-01-10 ENCOUNTER — Ambulatory Visit: Payer: Managed Care, Other (non HMO) | Admitting: Neurology
